# Patient Record
Sex: Female | Born: 1955
Health system: Southern US, Community
[De-identification: ages and names within clinical notes are randomized; demographics above are authoritative.]

## PROBLEM LIST (undated history)

## (undated) DIAGNOSIS — I639 Cerebral infarction, unspecified: Secondary | ICD-10-CM

## (undated) DIAGNOSIS — T7840XA Allergy, unspecified, initial encounter: Secondary | ICD-10-CM

## (undated) DIAGNOSIS — Z972 Presence of dental prosthetic device (complete) (partial): Secondary | ICD-10-CM

## (undated) DIAGNOSIS — E785 Hyperlipidemia, unspecified: Secondary | ICD-10-CM

## (undated) DIAGNOSIS — I10 Essential (primary) hypertension: Secondary | ICD-10-CM

## (undated) DIAGNOSIS — K219 Gastro-esophageal reflux disease without esophagitis: Secondary | ICD-10-CM

## (undated) HISTORY — DX: Hyperlipidemia, unspecified: E78.5

## (undated) HISTORY — DX: Essential (primary) hypertension: I10

## (undated) HISTORY — PX: BRAIN SURGERY: SHX531

## (undated) HISTORY — PX: VOCAL CORD LATERALIZATION, ENDOSCOPIC APPROACH W/ MLB: SHX2664

## (undated) HISTORY — DX: Allergy, unspecified, initial encounter: T78.40XA

---

## 2008-08-21 DIAGNOSIS — I639 Cerebral infarction, unspecified: Secondary | ICD-10-CM

## 2008-08-21 HISTORY — DX: Cerebral infarction, unspecified: I63.9

## 2014-12-10 LAB — HM PAP SMEAR

## 2018-05-09 ENCOUNTER — Encounter: Payer: Self-pay | Admitting: Nurse Practitioner

## 2018-05-09 ENCOUNTER — Ambulatory Visit: Payer: BLUE CROSS/BLUE SHIELD | Admitting: Nurse Practitioner

## 2018-05-09 ENCOUNTER — Other Ambulatory Visit: Payer: Self-pay

## 2018-05-09 VITALS — BP 128/65 | HR 74 | Temp 98.1°F | Ht 59.5 in | Wt 149.0 lb

## 2018-05-09 DIAGNOSIS — R5383 Other fatigue: Secondary | ICD-10-CM

## 2018-05-09 DIAGNOSIS — E7800 Pure hypercholesterolemia, unspecified: Secondary | ICD-10-CM

## 2018-05-09 DIAGNOSIS — I10 Essential (primary) hypertension: Secondary | ICD-10-CM

## 2018-05-09 DIAGNOSIS — K219 Gastro-esophageal reflux disease without esophagitis: Secondary | ICD-10-CM | POA: Diagnosis not present

## 2018-05-09 DIAGNOSIS — L308 Other specified dermatitis: Secondary | ICD-10-CM

## 2018-05-09 DIAGNOSIS — Z7689 Persons encountering health services in other specified circumstances: Secondary | ICD-10-CM | POA: Diagnosis not present

## 2018-05-09 MED ORDER — CLOTRIMAZOLE-BETAMETHASONE 1-0.05 % EX CREA: 1.0000 "application " | TOPICAL_CREAM | Freq: Two times a day (BID) | CUTANEOUS | 1 refills | Status: AC

## 2018-05-09 MED ORDER — OMEPRAZOLE 20 MG PO CPDR: 20.0000 mg | DELAYED_RELEASE_CAPSULE | Freq: Every day | ORAL | 3 refills | Status: DC

## 2018-05-09 MED ORDER — TRIAMCINOLONE ACETONIDE 0.1 % EX CREA: 1.0000 "application " | TOPICAL_CREAM | Freq: Two times a day (BID) | CUTANEOUS | 1 refills | Status: DC

## 2018-05-09 NOTE — Progress Notes (Signed)
Subjective:    Patient ID: Ashley Hayes, female    DOB: [birth year], 62 y.o.   MRN: [id number]  Christiona Siddique is a 62 y.o. female presenting on 05/09/2018 for Establish Care (brain surgery x August 2018. Possible eczema behind the ears, under breast )   HPI Establish Care New Provider Pt last seen by PCP many years ago.  Has had recent brain surgery, but not locally.  Request records if patient is able to supply location.    Rash Skin rash behind ears, under left breast and inter labial folds.  Scaly skin, sometimes red, raw excoriated.  ? Eczema/psoriasis.  Has had this rash even in [place]  Was worse in heat/summer.  Tries to avoid underwear at night.  Cotton underwear, depends.  Has stress incontinence.  GERD Stomach pain, burning, heartburn with water or meals.  Bowel issues - incontinence occasionally (about 1x per month).  Tums help, but uses 2-4 per day.  Past Medical History:  Diagnosis Date  . Allergy   . Hyperlipidemia   . Hypertension    Past Surgical History:  Procedure Laterality Date  . BRAIN SURGERY  2010   brsin surgery  . VOCAL CORD LATERALIZATION, ENDOSCOPIC APPROACH W/ MLB       Social History   Socioeconomic History  . Marital status: Divorced    Spouse name: Not on file  . Number of children: Not on file  . Years of education: Not on file  . Highest education level: High school graduate  Occupational History  . Not on file  Social Needs  . Financial resource strain: Not hard at all  . Food insecurity:    Worry: Never true    Inability: Never true  . Transportation needs:    Medical: No    Non-medical: No  Tobacco Use  . Smoking status: Current Every Day Smoker    Packs/day: 0.25    Years: 45.00    Pack years: 11.25  . Smokeless tobacco: Never Used  Substance and Sexual Activity  . Alcohol use: Yes    Frequency: Never    Comment: rarely  . Drug use: Never  . Sexual activity: Not on file  Lifestyle  . Physical activity:    Days per  week: 7 days    Minutes per session: 60 min  . Stress: Not on file  Relationships  . Social connections:    Talks on phone: Not on file    Gets together: Not on file    Attends religious service: Not on file    Active member of club or organization: Not on file    Attends meetings of clubs or organizations: Not on file    Relationship status: Not on file  . Intimate partner violence:    Fear of current or ex partner: Not on file    Emotionally abused: Not on file    Physically abused: Not on file    Forced sexual activity: Not on file  Other Topics Concern  . Not on file  Social History Narrative  . Not on file   Family History  Problem Relation Age of Onset  . Breast cancer Mother   . Stomach cancer Father   . Thyroid disease Sister   . Esophageal cancer Sister   . Thyroid disease Brother   . Ovarian cancer Sister   . Thyroid disease Sister   . Prostate cancer Brother   . Leukemia Brother    Current Outpatient Medications on File Prior to  Visit  Medication Sig  . lisinopril-hydrochlorothiazide (PRINZIDE,ZESTORETIC) 10-12.5 MG tablet    No current facility-administered medications on file prior to visit.     Review of Systems  Constitutional: Positive for fatigue. Negative for chills and fever.  HENT: Negative for congestion and sore throat.   Eyes: Negative for pain.  Respiratory: Negative for cough, shortness of breath and wheezing.   Cardiovascular: Negative for chest pain, palpitations and leg swelling.  Gastrointestinal: Positive for abdominal pain (heartburn). Negative for blood in stool, constipation, diarrhea, nausea and vomiting.  Endocrine: Negative for polydipsia.  Musculoskeletal: Negative for back pain, myalgias and neck pain.  Skin: Positive for rash.  Allergic/Immunologic: Negative for environmental allergies.  Neurological: Negative for dizziness, weakness and headaches.  Hematological: Does not bruise/bleed easily.  Psychiatric/Behavioral:  Negative for dysphoric mood and suicidal ideas. The patient is not nervous/anxious.    Per HPI unless specifically indicated above     Objective:    BP 128/65 (BP Location: Right Arm, Patient Position: Sitting, Cuff Size: Normal)   Pulse 74   Temp 98.1 F (36.7 C) (Oral)   Ht 4' 11.5" (1.511 m)   Wt 149 lb (67.6 kg)   BMI 29.59 kg/m   Wt Readings from Last 3 Encounters:  05/09/18 149 lb (67.6 kg)    Physical Exam  Constitutional: She is oriented to person, place, and time. She appears well-developed and well-nourished. No distress.  HENT:  Head: Normocephalic and atraumatic.  Neurological: She is alert and oriented to person, place, and time.  Skin: Skin is warm and dry. Capillary refill takes less than 2 seconds. Rash (erythema without flakiness or break in skin integrity located in folds of skin) noted.  Psychiatric: She has a normal mood and affect. Her behavior is normal. Judgment and thought content normal.  Vitals reviewed.   Results for orders placed or performed in visit on 05/09/18  CBC with Differential/Platelet  Result Value Ref Range   WBC 7.4 3.8 - 10.8 Thousand/uL   RBC 4.89 3.80 - 5.10 Million/uL   Hemoglobin 14.3 11.7 - 15.5 g/dL   HCT 43.4 35.0 - 45.0 %   MCV 88.8 80.0 - 100.0 fL   MCH 29.2 27.0 - 33.0 pg   MCHC 32.9 32.0 - 36.0 g/dL   RDW 13.0 11.0 - 15.0 %   Platelets 186 140 - 400 Thousand/uL   MPV 11.2 7.5 - 12.5 fL   Neutro Abs 4,662 1,500 - 7,800 cells/uL   Lymphs Abs 2,079 850 - 3,900 cells/uL   WBC mixed population 533 200 - 950 cells/uL   Eosinophils Absolute 89 15 - 500 cells/uL   Basophils Absolute 37 0 - 200 cells/uL   Neutrophils Relative % 63 %   Total Lymphocyte 28.1 %   Monocytes Relative 7.2 %   Eosinophils Relative 1.2 %   Basophils Relative 0.5 %  COMPLETE METABOLIC PANEL WITH GFR  Result Value Ref Range   Glucose, Bld 105 (H) 65 - 99 mg/dL   BUN 24 7 - 25 mg/dL   Creat 1.01 (H) 0.50 - 0.99 mg/dL   GFR, Est Non African  American 60 > OR = 60 mL/min/1.35m2   GFR, Est African American 69 > OR = 60 mL/min/1.28m2   BUN/Creatinine Ratio 24 (H) 6 - 22 (calc)   Sodium 138 135 - 146 mmol/L   Potassium 4.0 3.5 - 5.3 mmol/L   Chloride 102 98 - 110 mmol/L   CO2 26 20 - 32 mmol/L   Calcium 9.7  8.6 - 10.4 mg/dL   Total Protein 7.7 6.1 - 8.1 g/dL   Albumin 4.4 3.6 - 5.1 g/dL   Globulin 3.3 1.9 - 3.7 g/dL (calc)   AG Ratio 1.3 1.0 - 2.5 (calc)   Total Bilirubin 0.3 0.2 - 1.2 mg/dL   Alkaline phosphatase (APISO) 84 33 - 130 U/L   AST 24 10 - 35 U/L   ALT 24 6 - 29 U/L  Lipid panel  Result Value Ref Range   Cholesterol 223 (H) <200 mg/dL   HDL 57 >50 mg/dL   Triglycerides 80 <150 mg/dL   LDL Cholesterol (Calc) 148 (H) mg/dL (calc)   Total CHOL/HDL Ratio 3.9 <5.0 (calc)   Non-HDL Cholesterol (Calc) 166 (H) <130 mg/dL (calc)  TSH  Result Value Ref Range   TSH 2.84 0.40 - 4.50 mIU/L      Assessment & Plan:   Problem List Items Addressed This Visit    None    Visit Diagnoses    Gastroesophageal reflux disease, esophagitis presence not specified    -  Primary Status unknown.  Recheck labs.  Continue meds without changes today.  Refills provided. Followup 1 months and after labs.    Relevant Medications   omeprazole (PRILOSEC) 20 MG capsule   Other Relevant Orders   CBC with Differential/Platelet (Completed)   Encounter to establish care     Previous PCP was at an unknown location.  Records will not be requested.  Past medical, family, and surgical history reviewed w/ pt.     Dermatitis associated with moisture     Consistent with moisture associated dermatitis. Similar to previous exposures during warm months. - Afebrile, no evidence of bacterial superinfection or loss of epidermal integrity.  Plan: 1. Start triamcinolone cream bid x 14 days.  Cautioned skin atrophy with prolonged use. 2. Avoid scratching 3. Return criteria given, 2 weeks if worsening or not improving    Relevant Medications    triamcinolone cream (KENALOG) 0.1 %   Fatigue, unspecified type     Patient states she has fatigue.  No known cause.  Labs today.  Follow-up after labs and in 3 months.   Relevant Orders   CBC with Differential/Platelet (Completed)   COMPLETE METABOLIC PANEL WITH GFR (Completed)   Lipid panel (Completed)   TSH (Completed)   Pure hypercholesterolemia     Status unknown.  Recheck labs.  Continue meds without changes today.  Refills provided. Followup in 1 months and after labs.    Relevant Medications   lisinopril-hydrochlorothiazide (PRINZIDE,ZESTORETIC) 10-12.5 MG tablet   atorvastatin (LIPITOR) 20 MG tablet   Other Relevant Orders   Lipid panel (Completed)   Essential hypertension     Stable today on exam.  Medications tolerated without side effects.  Continue at current doses.  Refills provided.  Check labs today. Followup 1 months.    Relevant Medications   lisinopril-hydrochlorothiazide (PRINZIDE,ZESTORETIC) 10-12.5 MG tablet   atorvastatin (LIPITOR) 20 MG tablet   Other Relevant Orders   COMPLETE METABOLIC PANEL WITH GFR (Completed)      Meds ordered this encounter  Medications  . omeprazole (PRILOSEC) 20 MG capsule    Sig: Take 1 capsule (20 mg total) by mouth daily.    Dispense:  30 capsule    Refill:  3    Order Specific Question:   Supervising Provider    Answer:   Olin Hauser [[id number]]  . triamcinolone cream (KENALOG) 0.1 %    Sig: Apply 1 application topically 2 (two)  times daily. Apply to bilateral ears    Dispense:  30 g    Refill:  1    Order Specific Question:   Supervising Provider    Answer:   Olin Hauser [[id number]]  . clotrimazole-betamethasone (LOTRISONE) cream    Sig: Apply 1 application topically 2 (two) times daily for 14 days. Apply to breasts and groin    Dispense:  90 g    Refill:  1    Order Specific Question:   Supervising Provider    Answer:   Olin Hauser [[id number]]  . atorvastatin (LIPITOR) 20 MG tablet    Sig: Take  1 tablet (20 mg total) by mouth daily at 6 PM.    Dispense:  30 tablet    Refill:  6    Order Specific Question:   Supervising Provider    Answer:   Olin Hauser [[id number]]   Follow up plan: Return in about 4 weeks (around 06/06/2018) for hypertension, cholesterol, fatigue.  Cassell Smiles, DNP, AGPCNP-BC Adult Gerontology Primary Care Nurse Practitioner [facility] 05/09/2018, 2:48 PM

## 2018-05-09 NOTE — Patient Instructions (Addendum)
Darel Hong,   Thank you for coming in to clinic today.  1. Moisture dermatitis under breast and groin.  Keep genital region clean and dry.   2.  Ears have what is most likely psoriasis.  3. Reflux: - Avoid trigger foods - START omeprazole 20 mg once daily 30 minutes before meals.  4. Fatigue: labs  You will be due for FASTING BLOOD WORK.  This means you should eat no food or drink after midnight.  Drink only water or coffee without cream/sugar on the morning of your lab visit. - Please go ahead and schedule a "Lab Only" visit in the morning at the clinic for lab draw in the next 7 days. - Your results will be available about 2-3 days after blood draw.  If you have set up a MyChart account, you can can log in to MyChart online to view your results and a brief explanation. Also, we can discuss your results together at your next office visit if you would like.  Please schedule a follow-up appointment with Cassell Smiles, AGNP. Return in about 4 weeks (around 06/06/2018) for hypertension, cholesterol, fatigue.  If you have any other questions or concerns, please feel free to call the clinic or send a message through [place]. You may also schedule an earlier appointment if necessary.  You will receive a survey after today's visit either digitally by e-mail or paper by [employer]. Your experiences and feedback matter to [place].  Please respond so we know how we are doing as we provide care for you.   Cassell Smiles, DNP, AGNP-BC Adult Gerontology Nurse Practitioner [place]

## 2018-05-10 ENCOUNTER — Other Ambulatory Visit: Payer: BLUE CROSS/BLUE SHIELD

## 2018-05-10 LAB — COMPLETE METABOLIC PANEL WITH GFR
AG Ratio: 1.3 (calc) (ref 1.0–2.5)
ALT: 24 U/L (ref 6–29)
AST: 24 U/L (ref 10–35)
Albumin: 4.4 g/dL (ref 3.6–5.1)
Alkaline phosphatase (APISO): 84 U/L (ref 33–130)
BUN/Creatinine Ratio: 24 (calc) — ABNORMAL HIGH (ref 6–22)
BUN: 24 mg/dL (ref 7–25)
CO2: 26 mmol/L (ref 20–32)
Calcium: 9.7 mg/dL (ref 8.6–10.4)
Chloride: 102 mmol/L (ref 98–110)
Creat: 1.01 mg/dL — ABNORMAL HIGH (ref 0.50–0.99)
GFR, Est African American: 69 mL/min/{1.73_m2} (ref >=60)
GFR, Est Non African American: 60 mL/min/{1.73_m2} (ref >=60)
Globulin: 3.3 g/dL (calc) (ref 1.9–3.7)
Glucose, Bld: 105 mg/dL — ABNORMAL HIGH (ref 65–99)
Potassium: 4 mmol/L (ref 3.5–5.3)
Sodium: 138 mmol/L (ref 135–146)
Total Bilirubin: 0.3 mg/dL (ref 0.2–1.2)
Total Protein: 7.7 g/dL (ref 6.1–8.1)

## 2018-05-10 LAB — CBC WITH DIFFERENTIAL/PLATELET
Basophils Absolute: 37 cells/uL (ref 0–200)
Basophils Relative: 0.5 %
Eosinophils Absolute: 89 cells/uL (ref 15–500)
Eosinophils Relative: 1.2 %
HCT: 43.4 % (ref 35.0–45.0)
Hemoglobin: 14.3 g/dL (ref 11.7–15.5)
Lymphs Abs: 2079 cells/uL (ref 850–3900)
MCH: 29.2 pg (ref 27.0–33.0)
MCHC: 32.9 g/dL (ref 32.0–36.0)
MCV: 88.8 fL (ref 80.0–100.0)
MPV: 11.2 fL (ref 7.5–12.5)
Monocytes Relative: 7.2 %
Neutro Abs: 4662 cells/uL (ref 1500–7800)
Neutrophils Relative %: 63 %
Platelets: 186 10*3/uL (ref 140–400)
RBC: 4.89 10*6/uL (ref 3.80–5.10)
RDW: 13 % (ref 11.0–15.0)
Total Lymphocyte: 28.1 %
WBC mixed population: 533 cells/uL (ref 200–950)
WBC: 7.4 10*3/uL (ref 3.8–10.8)

## 2018-05-10 LAB — LIPID PANEL
Cholesterol: 223 mg/dL — ABNORMAL HIGH (ref <200)
HDL: 57 mg/dL (ref >50)
LDL Cholesterol (Calc): 148 mg/dL (calc) — ABNORMAL HIGH
Non-HDL Cholesterol (Calc): 166 mg/dL (calc) — ABNORMAL HIGH (ref <130)
Total CHOL/HDL Ratio: 3.9 (calc) (ref <5.0)
Triglycerides: 80 mg/dL (ref <150)

## 2018-05-10 LAB — TSH: TSH: 2.84 mIU/L (ref 0.40–4.50)

## 2018-05-13 MED ORDER — ATORVASTATIN CALCIUM 20 MG PO TABS: 20.0000 mg | ORAL_TABLET | Freq: Every day | ORAL | 6 refills | Status: DC

## 2018-07-04 ENCOUNTER — Encounter: Payer: BLUE CROSS/BLUE SHIELD | Admitting: Nurse Practitioner

## 2018-07-09 ENCOUNTER — Encounter

## 2018-07-09 ENCOUNTER — Ambulatory Visit: Payer: Self-pay | Admitting: Family Medicine

## 2018-07-11 ENCOUNTER — Encounter: Payer: Self-pay | Admitting: Nurse Practitioner

## 2018-08-15 ENCOUNTER — Ambulatory Visit
Admission: RE | Admit: 2018-08-15 | Discharge: 2018-08-15 | Disposition: A | Discharge status: Home / Self Care | Payer: BLUE CROSS/BLUE SHIELD | Source: Ambulatory Visit | Attending: Nurse Practitioner | Admitting: Nurse Practitioner

## 2018-08-15 ENCOUNTER — Ambulatory Visit: Payer: BLUE CROSS/BLUE SHIELD | Admitting: Nurse Practitioner

## 2018-08-15 ENCOUNTER — Encounter: Payer: Self-pay | Admitting: Nurse Practitioner

## 2018-08-15 VITALS — BP 159/75 | HR 72 | Temp 98.5°F | Resp 16 | Ht 59.0 in | Wt 161.0 lb

## 2018-08-15 DIAGNOSIS — M79671 Pain in right foot: Secondary | ICD-10-CM

## 2018-08-15 DIAGNOSIS — Z8669 Personal history of other diseases of the nervous system and sense organs: Secondary | ICD-10-CM

## 2018-08-15 DIAGNOSIS — Z716 Tobacco abuse counseling: Secondary | ICD-10-CM

## 2018-08-15 MED ORDER — BUPROPION HCL ER (SR) 150 MG PO TB12: 150.0000 mg | ORAL_TABLET | Freq: Two times a day (BID) | ORAL | 2 refills | Status: DC

## 2018-08-15 NOTE — Patient Instructions (Addendum)
Darel Hong,   Thank you for coming in to clinic today.  1. For your smoking cessation, we are going to start Wellbutrin: Start bupropion SR (Wellbutrin SR) 150mg  tablet 7 days BEFORE your quit date.    Your quit date: 08/21/2018      START Wellbutrin on: 08/15/18  - Take Wellbutrin 150mg  1 tablet once daily for 3 days - Then, take 1 tablet TWICE daily (about every 12 hours) and continue at this dose for 7 weeks.  To quit smoking:  - Only start this treatment if you are mentally ready to quit. - Choose a quit date at least 7 days in the future.  - Start taking the medicine about 1-2 weeks before your quit date.  - Reduce the number of cigarettes you smoke each day you are on this medication until you eventually QUIT COMPLETELY on your quit date.  - Continue taking the Wellbutrin twice daily for total 7 weeks, we can continue this medication for up to 12 weeks.  2. Start taking Tylenol extra strength 1 to 2 tablets every 6-8 hours for aches or fever/chills for next few days as needed.  Do not take more than 3,000 mg in 24 hours from all medicines.  May take Ibuprofen as well if tolerated 200-400mg  every 8 hours as needed. May alternate tylenol and ibuprofen in same day. - Use heat and ice.  Apply this for 15 minutes at a time 6-8 times per day.   - Muscle rub with lidocaine, lidocaine patch, Biofreeze, or tiger balm for topical pain relief.  Avoid using this with heat and ice to avoid burns.  Please schedule a follow-up appointment with Cassell Smiles, AGNP. Return in about 6 weeks (around 09/26/2018) for smoking cessation.  If you have any other questions or concerns, please feel free to call the clinic or send a message through [place]. You may also schedule an earlier appointment if necessary.  You will receive a survey after today's visit either digitally by e-mail or paper by [employer]. Your experiences and feedback matter to [place].  Please respond so we know how we are doing as we provide  care for you.   Cassell Smiles, DNP, AGNP-BC Adult Gerontology Nurse Practitioner [place]

## 2018-08-15 NOTE — Progress Notes (Signed)
Subjective:    Patient ID: Ashley Hayes, female    DOB: 1956/07/20, 62 y.o.   MRN: [id number]  Ashley Hayes is a 62 y.o. female presenting on 08/15/2018 for Foot Swelling (Right side onset 3 weeks)   HPI Foot swelling Right sided foot swelling for 3 weeks.   Has had foot elevated and used ice for swelling without improvement. - Patient recalls no injury - denies leg pain - prior redness, but has improved over last couple of days.   - Pain and tenderness worse with walking  Cigarette smoking Patient has new year's resolution for quitting smoking Patient has had success with patch in past, but had environmental stressor from other smokers and restarted. - Chantix was great for cessation in past, but second time had nightmares and HI and does not wish  - Has not used wellbutrin in past.  History of cyst in brain Patient has not had recent followup after brain cyst removal 2018. Prior cyst s/p removal also in 2010.  Patient needs neurology follow-up for surveillance.  Past Medical History:  Diagnosis Date  . Allergy   . Hyperlipidemia   . Hypertension     Past Surgical History:  Procedure Laterality Date  . BRAIN SURGERY  2010   brsin surgery  . VOCAL CORD LATERALIZATION, ENDOSCOPIC APPROACH W/ MLB       Social History   Tobacco Use  . Smoking status: Current Every Day Smoker    Packs/day: 0.25    Years: 45.00    Pack years: 11.25  . Smokeless tobacco: Current User  Substance Use Topics  . Alcohol use: Yes    Frequency: Never    Comment: rarely  . Drug use: Never    Review of Systems Per HPI unless specifically indicated above     Objective:    BP (!) 159/75   Pulse 72   Temp 98.5 F (36.9 C) (Oral)   Resp 16   Ht 4\' 11"  (1.499 m)   Wt 161 lb (73 kg)   BMI 32.52 kg/m   Wt Readings from Last 3 Encounters:  08/15/18 161 lb (73 kg)  05/09/18 149 lb (67.6 kg)    Physical Exam Vitals signs reviewed.  Constitutional:      General: She is not in  acute distress.    Appearance: She is well-developed.  HENT:     Head: Normocephalic and atraumatic.  Cardiovascular:     Rate and Rhythm: Normal rate and regular rhythm.     Pulses:          Radial pulses are 2+ on the right side and 2+ on the left side.       Posterior tibial pulses are 1+ on the right side and 1+ on the left side.     Heart sounds: Normal heart sounds, S1 normal and S2 normal.  Pulmonary:     Effort: Pulmonary effort is normal. No respiratory distress.     Breath sounds: Normal breath sounds and air entry.  Musculoskeletal:     Right lower leg: No edema.     Left lower leg: No edema.     Comments: No bony tenderness.  Inspection reveals Mild mid-foot swelling.  Tenderness/weakness with plantar flexion due to mid-foot pain. Normal ankle ROM and is without swelling.    Skin:    General: Skin is warm and dry.     Capillary Refill: Capillary refill takes less than 2 seconds.  Neurological:     General: No focal  deficit present.     Mental Status: She is alert and oriented to person, place, and time. Mental status is at baseline.     Cranial Nerves: No cranial nerve deficit.     Sensory: No sensory deficit.     Gait: Gait normal.     Deep Tendon Reflexes: Reflexes are normal and symmetric.  Psychiatric:        Attention and Perception: Attention normal.        Mood and Affect: Mood and affect normal.        Behavior: Behavior normal. Behavior is cooperative.        Thought Content: Thought content normal.        Judgment: Judgment normal.     Results for orders placed or performed in visit on 05/09/18  CBC with Differential/Platelet  Result Value Ref Range   WBC 7.4 3.8 - 10.8 Thousand/uL   RBC 4.89 3.80 - 5.10 Million/uL   Hemoglobin 14.3 11.7 - 15.5 g/dL   HCT 43.4 35.0 - 45.0 %   MCV 88.8 80.0 - 100.0 fL   MCH 29.2 27.0 - 33.0 pg   MCHC 32.9 32.0 - 36.0 g/dL   RDW 13.0 11.0 - 15.0 %   Platelets 186 140 - 400 Thousand/uL   MPV 11.2 7.5 - 12.5 fL    Neutro Abs 4,662 1,500 - 7,800 cells/uL   Lymphs Abs 2,079 850 - 3,900 cells/uL   WBC mixed population 533 200 - 950 cells/uL   Eosinophils Absolute 89 15 - 500 cells/uL   Basophils Absolute 37 0 - 200 cells/uL   Neutrophils Relative % 63 %   Total Lymphocyte 28.1 %   Monocytes Relative 7.2 %   Eosinophils Relative 1.2 %   Basophils Relative 0.5 %  COMPLETE METABOLIC PANEL WITH GFR  Result Value Ref Range   Glucose, Bld 105 (H) 65 - 99 mg/dL   BUN 24 7 - 25 mg/dL   Creat 1.01 (H) 0.50 - 0.99 mg/dL   GFR, Est Non African American 60 > OR = 60 mL/min/1.66m2   GFR, Est African American 69 > OR = 60 mL/min/1.93m2   BUN/Creatinine Ratio 24 (H) 6 - 22 (calc)   Sodium 138 135 - 146 mmol/L   Potassium 4.0 3.5 - 5.3 mmol/L   Chloride 102 98 - 110 mmol/L   CO2 26 20 - 32 mmol/L   Calcium 9.7 8.6 - 10.4 mg/dL   Total Protein 7.7 6.1 - 8.1 g/dL   Albumin 4.4 3.6 - 5.1 g/dL   Globulin 3.3 1.9 - 3.7 g/dL (calc)   AG Ratio 1.3 1.0 - 2.5 (calc)   Total Bilirubin 0.3 0.2 - 1.2 mg/dL   Alkaline phosphatase (APISO) 84 33 - 130 U/L   AST 24 10 - 35 U/L   ALT 24 6 - 29 U/L  Lipid panel  Result Value Ref Range   Cholesterol 223 (H) <200 mg/dL   HDL 57 >50 mg/dL   Triglycerides 80 <150 mg/dL   LDL Cholesterol (Calc) 148 (H) mg/dL (calc)   Total CHOL/HDL Ratio 3.9 <5.0 (calc)   Non-HDL Cholesterol (Calc) 166 (H) <130 mg/dL (calc)  TSH  Result Value Ref Range   TSH 2.84 0.40 - 4.50 mIU/L      Assessment & Plan:   Problem List Items Addressed This Visit    None    Visit Diagnoses    Right foot pain    -  Primary Unknown origin of RIGHT foot pain. Cannot  fully exclude sprain, contusion, stress fracture.  Plan: 1. Xray Right foot 2. Treat with OTC pain meds (acetaminophen and ibuprofen).  Discussed alternate dosing and max dosing. 3. Apply heat and/or ice to affected area. 4. May also apply a muscle rub with lidocaine or lidocaine patch after heat or ice. 5. Wear a supportive, hard  bottomed shoe 6. Follow up prn no improvement in 4-6 weeks.    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  Needs routine neurology referral for follow-up.  Patient had been having regular neurology surveillance in [place] and wants to re-establish care. Referral placed.  Follow-up prn.   Relevant Orders   Ambulatory referral to Neurology   Encounter for smoking cessation counseling     Patient ready to quit smoking and wants to make this a new-year's resolution.   Discussion today >5 minutes (<10 minutes) specifically on counseling on risks of tobacco use, complications, treatment, smoking cessation.  Plan:  7 days prior to quit date, START Wellbutrin.  Take 150 mg tablet once daily 3 days.  Then, take 150 mg tablet twice daily and continue for total treatment w/ Wellbutrin of 8-12 weeks.  Stop smoking or reduce by half on quit date. - Mutually set quit date: 08/21/2018 - START date of Chantix: 08/14/2018 - Reviewed side effects of GI upset, insomnia, depression, possible SI.   Relevant Medications   buPROPion (WELLBUTRIN SR) 150 MG 12 hr tablet      Meds ordered this encounter  Medications  . buPROPion (WELLBUTRIN SR) 150 MG 12 hr tablet    Sig: Take 1 tablet (150 mg total) by mouth 2 (two) times daily.    Dispense:  60 tablet    Refill:  2    Order Specific Question:   Supervising Provider    Answer:   Olin Hauser [[id number]]    Follow up plan: Return in about 6 weeks (around 09/26/2018) for smoking cessation.  Cassell Smiles, DNP, AGPCNP-BC Adult Gerontology Primary Care Nurse Practitioner [facility] 08/15/2018, 1:47 PM

## 2018-08-19 ENCOUNTER — Other Ambulatory Visit: Payer: Self-pay

## 2018-08-19 ENCOUNTER — Other Ambulatory Visit (HOSPITAL_COMMUNITY)
Admission: RE | Admit: 2018-08-19 | Discharge: 2018-08-19 | Disposition: A | Discharge status: Home / Self Care | Payer: BLUE CROSS/BLUE SHIELD | Source: Ambulatory Visit | Attending: Nurse Practitioner | Admitting: Nurse Practitioner

## 2018-08-19 ENCOUNTER — Encounter: Payer: Self-pay | Admitting: Nurse Practitioner

## 2018-08-19 ENCOUNTER — Ambulatory Visit (INDEPENDENT_AMBULATORY_CARE_PROVIDER_SITE_OTHER): Payer: BLUE CROSS/BLUE SHIELD | Admitting: Nurse Practitioner

## 2018-08-19 VITALS — BP 145/64 | HR 79 | Temp 97.4°F | Ht 59.0 in | Wt 161.4 lb

## 2018-08-19 DIAGNOSIS — Z124 Encounter for screening for malignant neoplasm of cervix: Secondary | ICD-10-CM | POA: Insufficient documentation

## 2018-08-19 DIAGNOSIS — Z23 Encounter for immunization: Secondary | ICD-10-CM

## 2018-08-19 DIAGNOSIS — Z13820 Encounter for screening for osteoporosis: Secondary | ICD-10-CM

## 2018-08-19 DIAGNOSIS — Z1211 Encounter for screening for malignant neoplasm of colon: Secondary | ICD-10-CM | POA: Diagnosis not present

## 2018-08-19 DIAGNOSIS — Z1239 Encounter for other screening for malignant neoplasm of breast: Secondary | ICD-10-CM

## 2018-08-19 DIAGNOSIS — Z0000 Encounter for general adult medical examination without abnormal findings: Secondary | ICD-10-CM
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
## 2018-08-19 NOTE — Patient Instructions (Addendum)
Ashley Hayes,   Thank you for coming in to clinic today.  1. You will be due for FASTING BLOOD WORK.  This means you should eat no food or drink after midnight.  Drink only water or coffee without cream/sugar on the morning of your lab visit. - Please go ahead and schedule a "Lab Only" visit in the morning at the clinic for lab draw in the next 7 days. - Your results will be available about 2-3 days after blood draw.  If you have set up a MyChart account, you can can log in to MyChart online to view your results and a brief explanation. Also, we can discuss your results together at your next office visit if you would like.  2. You will get a call to schedule your colonoscopy for colon cancer screening. [facility] ([facility] [address] [place], [place] [address] Phone: [phone]  3. Your mammogram and bone density scan orders have been placed.  Call the Scheduling phone number at [phone] to schedule your mammogram and bone density at your convenience.  You can choose to go to either location listed below.  Let the scheduler know which location you prefer.  [place]  [place], [place] [address]   [facility] [address] [place], [place] [address]  Please schedule a follow-up appointment with Cassell Smiles, AGNP. Return in about 1 year (around 08/20/2019) for annual physical.  If you have any other questions or concerns, please feel free to call the clinic or send a message through [place]. You may also schedule an earlier appointment if necessary.  You will receive a survey after today's visit either digitally by e-mail or paper by [employer]. Your experiences and feedback matter to [place].  Please respond so we know how we are doing as we provide care for you.   Cassell Smiles, DNP, AGNP-BC Adult Gerontology Nurse Practitioner [place]

## 2018-08-20 ENCOUNTER — Encounter: Payer: Self-pay | Admitting: Nurse Practitioner

## 2018-08-22 ENCOUNTER — Telehealth: Payer: Self-pay

## 2018-08-22 NOTE — Telephone Encounter (Signed)
Pt is returning a call for Ashley Hayes to schedule colonoscopy

## 2018-08-23 ENCOUNTER — Other Ambulatory Visit: Payer: Self-pay

## 2018-08-23 DIAGNOSIS — Z1211 Encounter for screening for malignant neoplasm of colon: Secondary | ICD-10-CM

## 2018-08-23 LAB — TSH: TSH: 3.05 mIU/L (ref 0.40–4.50)

## 2018-08-23 LAB — CYTOLOGY - PAP
Diagnosis: NEGATIVE
HPV: NOT DETECTED

## 2018-08-23 LAB — CBC WITH DIFFERENTIAL/PLATELET
Absolute Monocytes: 579 cells/uL (ref 200–950)
Basophils Absolute: 39 cells/uL (ref 0–200)
Basophils Relative: 0.6 %
Eosinophils Absolute: 117 cells/uL (ref 15–500)
Eosinophils Relative: 1.8 %
HCT: 41.1 % (ref 35.0–45.0)
Hemoglobin: 13.9 g/dL (ref 11.7–15.5)
Lymphs Abs: 1651 cells/uL (ref 850–3900)
MCH: 29.4 pg (ref 27.0–33.0)
MCHC: 33.8 g/dL (ref 32.0–36.0)
MCV: 87.1 fL (ref 80.0–100.0)
MPV: 11.5 fL (ref 7.5–12.5)
Monocytes Relative: 8.9 %
Neutro Abs: 4115 cells/uL (ref 1500–7800)
Neutrophils Relative %: 63.3 %
Platelets: 160 10*3/uL (ref 140–400)
RBC: 4.72 10*6/uL (ref 3.80–5.10)
RDW: 13 % (ref 11.0–15.0)
Total Lymphocyte: 25.4 %
WBC: 6.5 10*3/uL (ref 3.8–10.8)

## 2018-08-23 LAB — LIPID PANEL
Cholesterol: 173 mg/dL (ref <200)
HDL: 59 mg/dL (ref >50)
LDL Cholesterol (Calc): 93 mg/dL (calc)
Non-HDL Cholesterol (Calc): 114 mg/dL (calc) (ref <130)
Total CHOL/HDL Ratio: 2.9 (calc) (ref <5.0)
Triglycerides: 112 mg/dL (ref <150)

## 2018-08-23 LAB — HEMOGLOBIN A1C
Hgb A1c MFr Bld: 6.3 % of total Hgb — ABNORMAL HIGH (ref <5.7)
Mean Plasma Glucose: 134 (calc)
eAG (mmol/L): 7.4 (calc)

## 2018-08-23 LAB — COMPLETE METABOLIC PANEL WITH GFR
AG Ratio: 1.2 (calc) (ref 1.0–2.5)
ALT: 23 U/L (ref 6–29)
AST: 23 U/L (ref 10–35)
Albumin: 3.8 g/dL (ref 3.6–5.1)
Alkaline phosphatase (APISO): 98 U/L (ref 33–130)
BUN: 14 mg/dL (ref 7–25)
CO2: 28 mmol/L (ref 20–32)
Calcium: 9.2 mg/dL (ref 8.6–10.4)
Chloride: 104 mmol/L (ref 98–110)
Creat: 0.79 mg/dL (ref 0.50–0.99)
GFR, Est African American: 92 mL/min/{1.73_m2} (ref >=60)
GFR, Est Non African American: 80 mL/min/{1.73_m2} (ref >=60)
Globulin: 3.1 g/dL (calc) (ref 1.9–3.7)
Glucose, Bld: 105 mg/dL — ABNORMAL HIGH (ref 65–99)
Potassium: 3.9 mmol/L (ref 3.5–5.3)
Sodium: 140 mmol/L (ref 135–146)
Total Bilirubin: 0.4 mg/dL (ref 0.2–1.2)
Total Protein: 6.9 g/dL (ref 6.1–8.1)

## 2018-08-23 LAB — HEPATITIS C ANTIBODY
Hepatitis C Ab: NONREACTIVE
SIGNAL TO CUT-OFF: 0.04 (ref <1.00)

## 2018-08-23 LAB — HIV ANTIBODY (ROUTINE TESTING W REFLEX): HIV 1&2 Ab, 4th Generation: NONREACTIVE

## 2018-08-23 NOTE — Telephone Encounter (Signed)
Patients call has been returned.  She has been scheduled for her colonoscopy with Dr. Allen Norris 09/06/18 at [facility].  Thanks [employer]

## 2018-09-02 ENCOUNTER — Other Ambulatory Visit: Payer: Self-pay

## 2018-09-02 ENCOUNTER — Encounter: Payer: Self-pay | Admitting: *Deleted

## 2018-09-05 ENCOUNTER — Telehealth: Payer: Self-pay | Admitting: Nurse Practitioner

## 2018-09-05 NOTE — Telephone Encounter (Signed)
Pt wants to make sure her colonoscopy is coded as preventative so she doesn't have to pay a large copay tomorrow [phone]

## 2018-09-05 NOTE — Discharge Instructions (Signed)
General Anesthesia, Adult, Care After  This sheet gives you information about how to care for yourself after your procedure. Your health care provider may also give you more specific instructions. If you have problems or questions, contact your health care provider.  What can I expect after the procedure?  After the procedure, the following side effects are common:  Pain or discomfort at the IV site.  Nausea.  Vomiting.  Sore throat.  Trouble concentrating.  Feeling cold or chills.  Weak or tired.  Sleepiness and fatigue.  Soreness and body aches. These side effects can affect parts of the body that were not involved in surgery.  Follow these instructions at home:    For at least 24 hours after the procedure:  Have a responsible adult stay with you. It is important to have someone help care for you until you are awake and alert.  Rest as needed.  Do not:  Participate in activities in which you could fall or become injured.  Drive.  Use heavy machinery.  Drink alcohol.  Take sleeping pills or medicines that cause drowsiness.  Make important decisions or sign legal documents.  Take care of children on your own.  Eating and drinking  Follow any instructions from your health care provider about eating or drinking restrictions.  When you feel hungry, start by eating small amounts of foods that are soft and easy to digest (bland), such as toast. Gradually return to your regular diet.  Drink enough fluid to keep your urine pale yellow.  If you vomit, rehydrate by drinking water, juice, or clear broth.  General instructions  If you have sleep apnea, surgery and certain medicines can increase your risk for breathing problems. Follow instructions from your health care provider about wearing your sleep device:  Anytime you are sleeping, including during daytime naps.  While taking prescription pain medicines, sleeping medicines, or medicines that make you drowsy.  Return to your normal activities as told by your health care  provider. Ask your health care provider what activities are safe for you.  Take over-the-counter and prescription medicines only as told by your health care provider.  If you smoke, do not smoke without supervision.  Keep all follow-up visits as told by your health care provider. This is important.  Contact a health care provider if:  You have nausea or vomiting that does not get better with medicine.  You cannot eat or drink without vomiting.  You have pain that does not get better with medicine.  You are unable to pass urine.  You develop a skin rash.  You have a fever.  You have redness around your IV site that gets worse.  Get help right away if:  You have difficulty breathing.  You have chest pain.  You have blood in your urine or stool, or you vomit blood.  Summary  After the procedure, it is common to have a sore throat or nausea. It is also common to feel tired.  Have a responsible adult stay with you for the first 24 hours after general anesthesia. It is important to have someone help care for you until you are awake and alert.  When you feel hungry, start by eating small amounts of foods that are soft and easy to digest (bland), such as toast. Gradually return to your regular diet.  Drink enough fluid to keep your urine pale yellow.  Return to your normal activities as told by your health care provider. Ask your health care   provider what activities are safe for you.  This information is not intended to replace advice given to you by your health care provider. Make sure you discuss any questions you have with your health care provider.  Document Released: 11/13/2000 Document Revised: 03/23/2017 Document Reviewed: 03/23/2017  Elsevier Interactive Patient Education  2019 [employer]vier Inc[employer]

## 2018-09-05 NOTE — Telephone Encounter (Signed)
This will need to be addressed with her GI specialist who performs the service.  We do not bill or code anything in relationship to her colonoscopy.

## 2018-09-05 NOTE — Telephone Encounter (Signed)
Please advise 

## 2018-09-06 ENCOUNTER — Ambulatory Visit: Payer: BLUE CROSS/BLUE SHIELD | Admitting: Anesthesiology

## 2018-09-06 ENCOUNTER — Encounter
Admission: RE | Disposition: A | Discharge status: Home / Self Care | Payer: Self-pay | Source: Home / Self Care | Attending: Gastroenterology

## 2018-09-06 ENCOUNTER — Ambulatory Visit
Admission: RE | Admit: 2018-09-06 | Discharge: 2018-09-06 | Disposition: A | Discharge status: Home / Self Care | Payer: BLUE CROSS/BLUE SHIELD | Attending: Gastroenterology | Admitting: Gastroenterology

## 2018-09-06 DIAGNOSIS — K641 Second degree hemorrhoids: Secondary | ICD-10-CM | POA: Insufficient documentation

## 2018-09-06 DIAGNOSIS — Z6831 Body mass index (BMI) 31.0-31.9, adult: Secondary | ICD-10-CM | POA: Diagnosis not present

## 2018-09-06 DIAGNOSIS — D124 Benign neoplasm of descending colon: Secondary | ICD-10-CM

## 2018-09-06 DIAGNOSIS — D125 Benign neoplasm of sigmoid colon: Secondary | ICD-10-CM | POA: Diagnosis not present

## 2018-09-06 DIAGNOSIS — K5730 Diverticulosis of large intestine without perforation or abscess without bleeding: Secondary | ICD-10-CM | POA: Diagnosis not present

## 2018-09-06 DIAGNOSIS — I10 Essential (primary) hypertension: Secondary | ICD-10-CM | POA: Insufficient documentation

## 2018-09-06 DIAGNOSIS — Z79899 Other long term (current) drug therapy: Secondary | ICD-10-CM | POA: Diagnosis not present

## 2018-09-06 DIAGNOSIS — Z800 Family history of malignant neoplasm of digestive organs: Secondary | ICD-10-CM | POA: Diagnosis not present

## 2018-09-06 DIAGNOSIS — E785 Hyperlipidemia, unspecified: Secondary | ICD-10-CM | POA: Diagnosis not present

## 2018-09-06 DIAGNOSIS — Z8673 Personal history of transient ischemic attack (TIA), and cerebral infarction without residual deficits: Secondary | ICD-10-CM | POA: Diagnosis not present

## 2018-09-06 DIAGNOSIS — Z1211 Encounter for screening for malignant neoplasm of colon: Secondary | ICD-10-CM

## 2018-09-06 DIAGNOSIS — F17210 Nicotine dependence, cigarettes, uncomplicated: Secondary | ICD-10-CM | POA: Diagnosis not present

## 2018-09-06 DIAGNOSIS — K621 Rectal polyp: Secondary | ICD-10-CM | POA: Diagnosis not present

## 2018-09-06 DIAGNOSIS — K635 Polyp of colon: Secondary | ICD-10-CM | POA: Diagnosis not present

## 2018-09-06 DIAGNOSIS — K219 Gastro-esophageal reflux disease without esophagitis: Secondary | ICD-10-CM | POA: Insufficient documentation

## 2018-09-06 DIAGNOSIS — E669 Obesity, unspecified: Secondary | ICD-10-CM | POA: Diagnosis not present

## 2018-09-06 HISTORY — DX: Gastro-esophageal reflux disease without esophagitis: K21.9

## 2018-09-06 HISTORY — PX: POLYPECTOMY: SHX5525

## 2018-09-06 HISTORY — DX: Presence of dental prosthetic device (complete) (partial): Z97.2

## 2018-09-06 HISTORY — DX: Cerebral infarction, unspecified: I63.9

## 2018-09-06 HISTORY — PX: COLONOSCOPY WITH PROPOFOL: SHX5780

## 2018-09-06 SURGERY — COLONOSCOPY WITH PROPOFOL
Anesthesia: General | Site: Rectum

## 2018-09-06 MED ORDER — ONDANSETRON HCL 4 MG/2ML IJ SOLN: 4.0000 mg | Freq: Once | INTRAMUSCULAR | Status: DC | PRN

## 2018-09-06 MED ORDER — PROPOFOL 10 MG/ML IV BOLUS
INTRAVENOUS | Status: DC | PRN
  Administered 2018-09-06: 100 mg via INTRAVENOUS
  Administered 2018-09-06: 50 mg via INTRAVENOUS
  Administered 2018-09-06: 20 mg via INTRAVENOUS

## 2018-09-06 MED ORDER — SODIUM CHLORIDE 0.9 % IV SOLN: INTRAVENOUS | Status: DC

## 2018-09-06 MED ORDER — LIDOCAINE HCL (CARDIAC) PF 100 MG/5ML IV SOSY
PREFILLED_SYRINGE | INTRAVENOUS | Status: DC | PRN
  Administered 2018-09-06: 30 mg via INTRAVENOUS

## 2018-09-06 MED ORDER — LACTATED RINGERS IV SOLN
10.0000 mL/h | INTRAVENOUS | Status: DC
  Administered 2018-09-06: 10 mL/h via INTRAVENOUS

## 2018-09-06 MED ORDER — STERILE WATER FOR IRRIGATION IR SOLN
Status: DC | PRN
  Administered 2018-09-06: 10:00:00

## 2018-09-06 SURGICAL SUPPLY — 7 items
CANISTER SUCT 1200ML W/VALVE (MISCELLANEOUS) ×4 IMPLANT
FORCEPS BIOP RAD 4 LRG CAP 4 (CUTTING FORCEPS) ×2 IMPLANT
GOWN CVR UNV OPN BCK APRN NK (MISCELLANEOUS) ×4 IMPLANT
GOWN ISOL THUMB LOOP REG UNIV (MISCELLANEOUS) ×4
SNARE SHORT THROW 13M SML OVAL (MISCELLANEOUS) ×2 IMPLANT
TRAP ETRAP POLY (MISCELLANEOUS) ×2 IMPLANT
WATER STERILE IRR 250ML POUR (IV SOLUTION) ×4 IMPLANT

## 2018-09-06 NOTE — Transfer of Care (Signed)
Immediate Anesthesia Transfer of Care Note  Patient: Ashley Hayes  Procedure(s) Performed: COLONOSCOPY WITH PROPOFOL (N/A Rectum) POLYPECTOMY (Rectum)  Patient Location: PACU  Anesthesia Type: General  Level of Consciousness: awake, alert  and patient cooperative  Airway and Oxygen Therapy: Patient Spontanous Breathing and Patient connected to supplemental oxygen  Post-op Assessment: Post-op Vital signs reviewed, Patient's Cardiovascular Status Stable, Respiratory Function Stable, Patent Airway and No signs of Nausea or vomiting  Post-op Vital Signs: Reviewed and stable  Complications: No apparent anesthesia complications

## 2018-09-06 NOTE — Anesthesia Postprocedure Evaluation (Signed)
Anesthesia Post Note  Patient: Ashley Hayes  Procedure(s) Performed: COLONOSCOPY WITH PROPOFOL (N/A Rectum) POLYPECTOMY (Rectum)  Patient location during evaluation: PACU Anesthesia Type: General Level of consciousness: awake Pain management: pain level controlled Vital Signs Assessment: post-procedure vital signs reviewed and stable Respiratory status: respiratory function stable Cardiovascular status: stable Postop Assessment: no signs of nausea or vomiting Anesthetic complications: no    Veda Canning

## 2018-09-06 NOTE — H&P (Signed)
Ashley Lame, MD [facility] [address], [place], [place] [address] Phone: [phone] Fax : [phone]  Primary Care Physician:  Mikey College, NP Primary Gastroenterologist:  Dr. Allen Norris  Pre-Procedure History & Physical: HPI:  Ashley Hayes is a 63 y.o. female is here for a screening colonoscopy.   Past Medical History:  Diagnosis Date  . Allergy   . GERD (gastroesophageal reflux disease)   . Hyperlipidemia   . Hypertension   . Presence of dental bridge    removable - upper  . Stroke [facility]) 2010   due to brain cyst - no residual    Past Surgical History:  Procedure Laterality Date  . BRAIN SURGERY  2010, 2018   brain surgery  . VOCAL CORD LATERALIZATION, ENDOSCOPIC APPROACH W/ MLB      Prior to Admission medications   Medication Sig Start Date End Date Taking? Authorizing Provider  atorvastatin (LIPITOR) 20 MG tablet Take 1 tablet (20 mg total) by mouth daily at 6 PM. 05/13/18  Yes Mikey College, NP  lisinopril-hydrochlorothiazide (PRINZIDE,ZESTORETIC) 10-12.5 MG tablet  04/06/18  Yes [provider]  omeprazole (PRILOSEC) 20 MG capsule Take 1 capsule (20 mg total) by mouth daily. 05/09/18  Yes Mikey College, NP  triamcinolone cream (KENALOG) 0.1 % Apply 1 application topically 2 (two) times daily. Apply to bilateral ears 05/09/18  Yes Mikey College, NP  buPROPion [facility] SR) 150 MG 12 hr tablet Take 1 tablet (150 mg total) by mouth 2 (two) times daily. Patient not taking: Reported on 08/19/2018 08/15/18   Mikey College, NP    Allergies as of 08/23/2018  . (No Known Allergies)    Family History  Problem Relation Age of Onset  . Breast cancer Mother   . Stomach cancer Father   . Thyroid disease Sister   . Esophageal cancer Sister   . Thyroid disease Brother   . Ovarian cancer Sister   . Thyroid disease Sister   . Prostate cancer Brother   . Leukemia Brother     Social History   Socioeconomic History  .  Marital status: Divorced    Spouse name: Not on file  . Number of children: Not on file  . Years of education: Not on file  . Highest education level: High school graduate  Occupational History  . Not on file  Social Needs  . Financial resource strain: Not hard at all  . Food insecurity:    Worry: Never true    Inability: Never true  . Transportation needs:    Medical: No    Non-medical: No  Tobacco Use  . Smoking status: Current Every Day Smoker    Packs/day: 0.25    Years: 45.00    Pack years: 11.25  . Smokeless tobacco: Never Used  . Tobacco comment: down to approx 3 cigs/day  Substance and Sexual Activity  . Alcohol use: Yes    Frequency: Never    Comment: rarely  . Drug use: Never  . Sexual activity: Not on file  Lifestyle  . Physical activity:    Days per week: 7 days    Minutes per session: 60 min  . Stress: Not on file  Relationships  . Social connections:    Talks on phone: Not on file    Gets together: Not on file    Attends religious service: Not on file    Active member of club or organization: Not on file    Attends meetings of clubs  or organizations: Not on file    Relationship status: Not on file  . Intimate partner violence:    Fear of current or ex partner: Not on file    Emotionally abused: Not on file    Physically abused: Not on file    Forced sexual activity: Not on file  Other Topics Concern  . Not on file  Social History Narrative  . Not on file    Review of Systems: See HPI, otherwise negative ROS  Physical Exam: BP 129/80   Pulse 77   Temp 98.1 F (36.7 C) (Temporal)   Resp 17   Ht 4\' 11"  (1.499 m)   Wt 71.7 kg   SpO2 97%   BMI 31.91 kg/m  General:   Alert,  pleasant and cooperative in NAD Head:  Normocephalic and atraumatic. Neck:  Supple; no masses or thyromegaly. Lungs:  Clear throughout to auscultation.    Heart:  Regular rate and rhythm. Abdomen:  Soft, nontender and nondistended. Normal bowel sounds, without  guarding, and without rebound.   Neurologic:  Alert and  oriented x4;  grossly normal neurologically.  Impression/Plan: Ashley Hayes is now here to undergo a screening colonoscopy.  Risks, benefits, and alternatives regarding colonoscopy have been reviewed with the patient.  Questions have been answered.  All parties agreeable.

## 2018-09-06 NOTE — Anesthesia Procedure Notes (Signed)
Date/Time: 09/06/2018 10:25 AM Performed by: Cameron Ali, CRNA Pre-anesthesia Checklist: Patient identified, Emergency Drugs available, Suction available, Timeout performed and Patient being monitored Patient Re-evaluated:Patient Re-evaluated prior to induction Oxygen Delivery Method: Nasal cannula Placement Confirmation: positive ETCO2

## 2018-09-06 NOTE — Op Note (Signed)
[facility] Gastroenterology Patient Name: Ashley Hayes Procedure Date: 09/06/2018 10:15 AM MRN: [id number] Account #: [id number] Date of Birth: Dec 24, 1955 Admit Type: Outpatient Age: 63 Room: [facility] OR ROOM 01 Gender: Female Note Status: Finalized Procedure:            Colonoscopy Indications:          Screening for colorectal malignant neoplasm Providers:            Lucilla Lame MD, MD Referring MD:         Mikey [employer] (Referring MD) Medicines:            Propofol per Anesthesia Complications:        No immediate complications. Procedure:            Pre-Anesthesia Assessment:                       - Prior to the procedure, a History and Physical was                        performed, and patient medications and allergies were                        reviewed. The patient's tolerance of previous                        anesthesia was also reviewed. The risks and benefits of                        the procedure and the sedation options and risks were                        discussed with the patient. All questions were                        answered, and informed consent was obtained. Prior                        Anticoagulants: The patient has taken no previous                        anticoagulant or antiplatelet agents. ASA Grade                        Assessment: II - A patient with mild systemic disease.                        After reviewing the risks and benefits, the patient was                        deemed in satisfactory condition to undergo the                        procedure.                       After obtaining informed consent, the colonoscope was                        passed under direct vision. Throughout the procedure,  the patient's blood pressure, pulse, and oxygen                        saturations were monitored continuously. The was                        introduced through the anus and advanced to the the                    cecum, identified by appendiceal orifice and ileocecal                        valve. The colonoscopy was performed without                        difficulty. The patient tolerated the procedure well.                        The quality of the bowel preparation was excellent. Findings:      The perianal and digital rectal examinations were normal.      A 3 mm polyp was found in the descending colon. The polyp was sessile.       The polyp was removed with a cold biopsy forceps. Resection and       retrieval were complete.      A 6 mm polyp was found in the sigmoid colon. The polyp was sessile. The       polyp was removed with a cold snare. Resection and retrieval were       complete.      A 3 mm polyp was found in the rectum. The polyp was sessile. The polyp       was removed with a cold snare. Resection and retrieval were complete.      A few small-mouthed diverticula were found in the sigmoid colon.      Non-bleeding internal hemorrhoids were found during retroflexion. The       hemorrhoids were Grade II (internal hemorrhoids that prolapse but reduce       spontaneously). Impression:           - One 3 mm polyp in the descending colon, removed with                        a cold biopsy forceps. Resected and retrieved.                       - One 6 mm polyp in the sigmoid colon, removed with a                        cold snare. Resected and retrieved.                       - One 3 mm polyp in the rectum, removed with a cold                        snare. Resected and retrieved.                       - Diverticulosis in the sigmoid colon.                       -  Non-bleeding internal hemorrhoids. Recommendation:       - Discharge patient to home.                       - Resume previous diet.                       - Continue present medications.                       - Await pathology results.                       - Repeat colonoscopy in 5 years if polyp adenoma and 10                         years if hyperplastic Procedure Code(s):    --- Professional ---                       [phone], Colonoscopy, flexible; with removal of tumor(s),                        polyp(s), or other lesion(s) by snare technique                       45380, 15, Colonoscopy, flexible; with biopsy, single                        or multiple Diagnosis Code(s):    --- Professional ---                       Z12.11, Encounter for screening for malignant neoplasm                        of colon                       D12.4, Benign neoplasm of descending colon                       D12.5, Benign neoplasm of sigmoid colon                       K62.1, Rectal polyp CPT copyright 2018 American Medical Association. All rights reserved. The codes documented in this report are preliminary and upon coder review may  be revised to meet current compliance requirements. Lucilla Lame MD, MD 09/06/2018 10:46:06 AM This report has been signed electronically. Number of Addenda: 0 Note Initiated On: 09/06/2018 10:15 AM Scope Withdrawal Time: 0 hours 9 minutes 49 seconds  Total Procedure Duration: 0 hours 14 minutes 6 seconds       [facility]

## 2018-09-06 NOTE — Anesthesia Preprocedure Evaluation (Signed)
Anesthesia Evaluation  Patient identified by MRN, date of birth, ID band Patient awake    Reviewed: Allergy & Precautions, NPO status , Patient's Chart, lab work & pertinent test results  Airway Mallampati: II  TM Distance: >3 FB     Dental   Pulmonary Current Smoker,    breath sounds clear to auscultation       Cardiovascular hypertension,  Rhythm:Regular Rate:Normal  HLD   Neuro/Psych CVA (due to brain cyst, no residual symptoms)    GI/Hepatic GERD  ,  Endo/Other  Obesity - BMI 32  Renal/GU      Musculoskeletal   Abdominal   Peds  Hematology   Anesthesia Other Findings   Reproductive/Obstetrics                             Anesthesia Physical Anesthesia Plan  ASA: III  Anesthesia Plan: General   Post-op Pain Management:    Induction: Intravenous  PONV Risk Score and Plan: Propofol infusion  Airway Management Planned: Natural Airway and Nasal Cannula  Additional Equipment:   Intra-op Plan:   Post-operative Plan:   Informed Consent: I have reviewed the patients History and Physical, chart, labs and discussed the procedure including the risks, benefits and alternatives for the proposed anesthesia with the patient or authorized representative who has indicated his/her understanding and acceptance.       Plan Discussed with: CRNA  Anesthesia Plan Comments:         Anesthesia Quick Evaluation

## 2018-09-09 ENCOUNTER — Encounter: Payer: Self-pay | Admitting: Gastroenterology

## 2018-09-10 ENCOUNTER — Encounter: Payer: Self-pay | Admitting: Gastroenterology

## 2018-09-12 ENCOUNTER — Encounter: Payer: Self-pay | Admitting: Gastroenterology

## 2018-09-17 ENCOUNTER — Other Ambulatory Visit: Payer: Self-pay | Admitting: Neurology

## 2018-09-17 ENCOUNTER — Other Ambulatory Visit (HOSPITAL_COMMUNITY): Payer: Self-pay | Admitting: Neurology

## 2018-09-17 DIAGNOSIS — Z8669 Personal history of other diseases of the nervous system and sense organs: Secondary | ICD-10-CM

## 2018-09-25 ENCOUNTER — Ambulatory Visit
Admission: RE | Admit: 2018-09-25 | Discharge: 2018-09-25 | Disposition: A | Discharge status: Home / Self Care | Payer: BLUE CROSS/BLUE SHIELD | Source: Ambulatory Visit | Attending: Neurology | Admitting: Neurology

## 2018-09-25 DIAGNOSIS — Z8669 Personal history of other diseases of the nervous system and sense organs: Secondary | ICD-10-CM | POA: Insufficient documentation

## 2018-09-27 ENCOUNTER — Ambulatory Visit (INDEPENDENT_AMBULATORY_CARE_PROVIDER_SITE_OTHER): Payer: BLUE CROSS/BLUE SHIELD | Admitting: Nurse Practitioner

## 2018-09-27 VITALS — BP 155/72 | Temp 97.9°F | Ht 59.0 in | Wt 161.6 lb

## 2018-09-27 DIAGNOSIS — R21 Rash and other nonspecific skin eruption: Secondary | ICD-10-CM | POA: Diagnosis not present

## 2018-09-27 DIAGNOSIS — J418 Mixed simple and mucopurulent chronic bronchitis: Secondary | ICD-10-CM

## 2018-09-27 DIAGNOSIS — G930 Cerebral cysts: Secondary | ICD-10-CM

## 2018-09-27 DIAGNOSIS — R7303 Prediabetes: Secondary | ICD-10-CM | POA: Diagnosis not present

## 2018-09-27 DIAGNOSIS — Z716 Tobacco abuse counseling: Secondary | ICD-10-CM | POA: Diagnosis not present

## 2018-09-30 ENCOUNTER — Encounter: Payer: Self-pay | Admitting: Nurse Practitioner

## 2018-09-30 DIAGNOSIS — J418 Mixed simple and mucopurulent chronic bronchitis: Secondary | ICD-10-CM | POA: Insufficient documentation

## 2018-09-30 DIAGNOSIS — R7303 Prediabetes: Secondary | ICD-10-CM | POA: Insufficient documentation

## 2018-09-30 DIAGNOSIS — G930 Cerebral cysts: Secondary | ICD-10-CM | POA: Insufficient documentation

## 2018-09-30 MED ORDER — UMECLIDINIUM-VILANTEROL 62.5-25 MCG/INH IN AEPB: 1.0000 | INHALATION_SPRAY | Freq: Every day | RESPIRATORY_TRACT | 5 refills | Status: DC

## 2018-09-30 NOTE — Progress Notes (Signed)
Subjective:    Patient ID: Ashley Hayes, female    DOB: 1955/10/24, 63 y.o.   MRN: [id number]  Ashley Hayes is a 63 y.o. female presenting on 09/27/2018 for Nicotine Dependence (Cessation) and COPD   HPI Smoking Cessation, COPD Patient presents today for follow-up of smoking cessation with starting Wellbutrin at last visit 08/19/2018.  When taking meds, patient was able to stop smoking completely, but but then had increased cravings after several days.  In the second week of taking medication.  Patient developed progression towards everyone.  She states that she wanted to kill her dog, so she stopped taking medication.  Patient states currently, stopping completely is preventing because she "likes the taste of her cigarettes." -Patient continues to want to quit due to breathlessness.  She gets good relief from this with daily Breo and uses albuterol as needed only with long walks.  Rash - beneath breasts Patient has a chronic rash underneath her breasts.  Notes that currently clotrimazole betamethasone cream has completely healed her rash.  However, she continues to have relapsing remitting rash when stopping medication.  Patient requests refill of this today.  Currently is asymptomatic.  Prediabetes Patient requests clarification of new prediabetes diagnosis documented after last labs from physical.  She is not currently taking medication and does not wish to start medications. - She is not currently symptomatic and denies polydipsia, polyphagia, polyuria, headaches, diaphoresis, shakiness, chills, pain, numbness or tingling in extremities and changes in vision.   - She notes knowledge deficit about how to eat a low-carb diet.  Recent Labs    08/22/18 0914  HGBA1C 6.3*   MRI results Patient also had a recent MRI of her brain for cyst follow-up.  She has had intracranial brain cyst x 2 with removal in past. She does not yet know her results and requests information today.  She is  asymptomatic neurologically today.  Social History   Tobacco Use  . Smoking status: Current Every Day Smoker    Packs/day: 0.25    Years: 45.00    Pack years: 11.25  . Smokeless tobacco: Never Used  . Tobacco comment: down to approx 3 cigs/day  Substance Use Topics  . Alcohol use: Yes    Frequency: Never    Comment: rarely  . Drug use: Never    Review of Systems Per HPI unless specifically indicated above     Objective:    There were no vitals taken for this visit.  Wt Readings from Last 3 Encounters:  09/06/18 158 lb (71.7 kg)  08/19/18 161 lb 6.4 oz (73.2 kg)  08/15/18 161 lb (73 kg)    Physical Exam Vitals signs reviewed.  Constitutional:      General: She is not in acute distress.    Appearance: She is well-developed.  HENT:     Head: Normocephalic and atraumatic.  Cardiovascular:     Rate and Rhythm: Normal rate and regular rhythm.     Pulses:          Radial pulses are 2+ on the right side and 2+ on the left side.       Posterior tibial pulses are 1+ on the right side and 1+ on the left side.     Heart sounds: Normal heart sounds, S1 normal and S2 normal.  Pulmonary:     Effort: Pulmonary effort is normal. No respiratory distress.     Breath sounds: Normal air entry. Examination of the right-lower field reveals rhonchi. Examination of the  left-lower field reveals rhonchi. Decreased breath sounds and rhonchi present. No wheezing or rales.  Musculoskeletal:     Right lower leg: No edema.     Left lower leg: No edema.  Skin:    General: Skin is warm and dry.     Capillary Refill: Capillary refill takes less than 2 seconds.  Neurological:     General: No focal deficit present.     Mental Status: She is alert and oriented to person, place, and time. Mental status is at baseline.     Cranial Nerves: No cranial nerve deficit.     Sensory: No sensory deficit.     Motor: No weakness.     Coordination: Coordination normal.     Gait: Gait normal.     Deep Tendon  Reflexes: Reflexes are normal and symmetric. Reflexes normal.  Psychiatric:        Attention and Perception: Attention normal.        Mood and Affect: Mood and affect normal.        Behavior: Behavior normal. Behavior is cooperative.        Thought Content: Thought content normal.        Judgment: Judgment normal.      Results for orders placed or performed in visit on 08/19/18  COMPLETE METABOLIC PANEL WITH GFR  Result Value Ref Range   Glucose, Bld 105 (H) 65 - 99 mg/dL   BUN 14 7 - 25 mg/dL   Creat 0.79 0.50 - 0.99 mg/dL   GFR, Est Non African American 80 > OR = 60 mL/min/1.15m2   GFR, Est African American 92 > OR = 60 mL/min/1.47m2   BUN/Creatinine Ratio NOT APPLICABLE 6 - 22 (calc)   Sodium 140 135 - 146 mmol/L   Potassium 3.9 3.5 - 5.3 mmol/L   Chloride 104 98 - 110 mmol/L   CO2 28 20 - 32 mmol/L   Calcium 9.2 8.6 - 10.4 mg/dL   Total Protein 6.9 6.1 - 8.1 g/dL   Albumin 3.8 3.6 - 5.1 g/dL   Globulin 3.1 1.9 - 3.7 g/dL (calc)   AG Ratio 1.2 1.0 - 2.5 (calc)   Total Bilirubin 0.4 0.2 - 1.2 mg/dL   Alkaline phosphatase (APISO) 98 33 - 130 U/L   AST 23 10 - 35 U/L   ALT 23 6 - 29 U/L  Lipid panel  Result Value Ref Range   Cholesterol 173 <200 mg/dL   HDL 59 >50 mg/dL   Triglycerides 112 <150 mg/dL   LDL Cholesterol (Calc) 93 mg/dL (calc)   Total CHOL/HDL Ratio 2.9 <5.0 (calc)   Non-HDL Cholesterol (Calc) 114 <130 mg/dL (calc)  TSH  Result Value Ref Range   TSH 3.05 0.40 - 4.50 mIU/L  Hemoglobin A1c  Result Value Ref Range   Hgb A1c MFr Bld 6.3 (H) <5.7 % of total Hgb   Mean Plasma Glucose 134 (calc)   eAG (mmol/L) 7.4 (calc)  CBC with Differential/Platelet  Result Value Ref Range   WBC 6.5 3.8 - 10.8 Thousand/uL   RBC 4.72 3.80 - 5.10 Million/uL   Hemoglobin 13.9 11.7 - 15.5 g/dL   HCT 41.1 35.0 - 45.0 %   MCV 87.1 80.0 - 100.0 fL   MCH 29.4 27.0 - 33.0 pg   MCHC 33.8 32.0 - 36.0 g/dL   RDW 13.0 11.0 - 15.0 %   Platelets 160 140 - 400 Thousand/uL   MPV  11.5 7.5 - 12.5 fL   Neutro Abs 4,115 1,500 -  7,800 cells/uL   Lymphs Abs 1,651 850 - 3,900 cells/uL   Absolute Monocytes 579 200 - 950 cells/uL   Eosinophils Absolute 117 15 - 500 cells/uL   Basophils Absolute 39 0 - 200 cells/uL   Neutrophils Relative % 63.3 %   Total Lymphocyte 25.4 %   Monocytes Relative 8.9 %   Eosinophils Relative 1.8 %   Basophils Relative 0.6 %  HIV Antibody (routine testing w rflx)  Result Value Ref Range   HIV 1&2 Ab, 4th Generation NON-REACTIVE NON-REACTI  Hepatitis C antibody  Result Value Ref Range   Hepatitis C Ab NON-REACTIVE NON-REACTI   SIGNAL TO CUT-OFF 0.04 <1.00  Cytology - PAP  Result Value Ref Range   Adequacy      Satisfactory for evaluation  endocervical/transformation zone component PRESENT.   Diagnosis      NEGATIVE FOR INTRAEPITHELIAL LESIONS OR MALIGNANCY.   HPV NOT DETECTED    Material Submitted CervicoVaginal Pap [ThinPrep Imaged]    CYTOLOGY - PAP PAP RESULT       Assessment & Plan:   Problem List Items Addressed This Visit      Respiratory   Mixed simple and mucopurulent chronic bronchitis (HCC) Stable, but uncontrolled with rhonchi bilateral lower lobes today and regular breathlessness with extended walks.  Symptoms controlled with albuterol as needed.  No  Plan: 1.  Stop Breo. -Start Anoro 1 inhalation daily. 2.  Continue albuterol 1 to 2 puffs every 4 hours as needed 3. PFT full at [facility] 4. Follow-up 3 months      Nervous and Auditory   Brain cyst Reviewed MRI with patient today.  - Confirmed with patient that cyst has recurred.  Reassured patient that she is currently asymptomatic and should continue to monitor symptoms.  Likely will not need urgent intervention. Further information pending from Dr. Melrose Nakayama.    Follow-up prn.     Other   Prediabetes Patient with new prediabetes diagnosis after her annual physical.  Mutual decision to withhold metformin at this time.  Will consider if A1c worsens next  check in 6 months.  Encouraged patient to eat low glycemic diet.  Handout provided.  Follow-up 6 months.    Other Visit Diagnoses    Encounter for smoking cessation counseling    -  Primary Patient with ongoing desire to stop smoking.  Failures in past of Chantix, recent failure Wellbutrin with aggression/agitation. - Discussed using 2 mg nicotine gum prn for smoking cessation.  Unlikely that patches will be successful since current smoking amount is less than 7mg  patch. - Continue substituting deep breathing/other activities prn when cravings occur. - Follow-up for possible Chantix in future.  Discussion today >5 minutes (<10 minutes) specifically on counseling on risks of tobacco use, complications, treatment, smoking cessation.     Rash, skin    - under breasts.  Asymptomatic without rash today. Continue Kenalog topically prn.  START moisture barrier cream for prevention of rash development under breasts. Follow-up prn.      Meds ordered this encounter  Medications  . umeclidinium-vilanterol (ANORO ELLIPTA) 62.5-25 MCG/INH AEPB    Sig: Inhale 1 puff into the lungs daily.    Dispense:  30 each    Refill:  5    Order Specific Question:   Supervising Provider    Answer:   Olin Hauser [[id number]]    Follow up plan: No follow-ups on file.  Cassell Smiles, DNP, AGPCNP-BC Adult Gerontology Primary Care Nurse Practitioner [facility] 09/30/2018, 9:59 AM

## 2018-10-14 ENCOUNTER — Other Ambulatory Visit: Payer: Self-pay | Admitting: Nurse Practitioner

## 2018-10-14 DIAGNOSIS — K219 Gastro-esophageal reflux disease without esophagitis: Secondary | ICD-10-CM

## 2018-11-28 ENCOUNTER — Encounter: Payer: Self-pay | Admitting: Nurse Practitioner

## 2018-11-28 DIAGNOSIS — E559 Vitamin D deficiency, unspecified: Secondary | ICD-10-CM | POA: Insufficient documentation

## 2018-11-28 DIAGNOSIS — R519 Headache, unspecified: Secondary | ICD-10-CM | POA: Insufficient documentation

## 2018-11-28 DIAGNOSIS — R51 Headache: Secondary | ICD-10-CM

## 2018-11-28 DIAGNOSIS — L409 Psoriasis, unspecified: Secondary | ICD-10-CM | POA: Insufficient documentation

## 2018-11-28 DIAGNOSIS — G8929 Other chronic pain: Secondary | ICD-10-CM | POA: Insufficient documentation

## 2018-11-28 DIAGNOSIS — F17200 Nicotine dependence, unspecified, uncomplicated: Secondary | ICD-10-CM | POA: Insufficient documentation

## 2018-11-28 DIAGNOSIS — I10 Essential (primary) hypertension: Secondary | ICD-10-CM | POA: Insufficient documentation

## 2018-12-30 ENCOUNTER — Encounter: Payer: Self-pay | Admitting: Family Medicine

## 2018-12-30 ENCOUNTER — Ambulatory Visit (INDEPENDENT_AMBULATORY_CARE_PROVIDER_SITE_OTHER): Payer: BLUE CROSS/BLUE SHIELD | Admitting: Family Medicine

## 2018-12-30 ENCOUNTER — Other Ambulatory Visit: Payer: Self-pay

## 2018-12-30 ENCOUNTER — Ambulatory Visit: Payer: BLUE CROSS/BLUE SHIELD | Admitting: Nurse Practitioner

## 2018-12-30 DIAGNOSIS — J418 Mixed simple and mucopurulent chronic bronchitis: Secondary | ICD-10-CM

## 2018-12-30 DIAGNOSIS — L308 Other specified dermatitis: Secondary | ICD-10-CM

## 2018-12-30 DIAGNOSIS — F17210 Nicotine dependence, cigarettes, uncomplicated: Secondary | ICD-10-CM | POA: Diagnosis not present

## 2018-12-30 MED ORDER — TRIAMCINOLONE ACETONIDE 0.1 % EX CREA: 1.0000 "application " | TOPICAL_CREAM | Freq: Two times a day (BID) | CUTANEOUS | 1 refills | Status: AC

## 2018-12-30 NOTE — Assessment & Plan Note (Signed)
Active smoke, ready to quit now Prior quit attempts, failed Wellbutrin, Chantix multiple times  Plan Given info for Quitline 1800QUITNOW start with this option to establish protocol for helping her quit and they can offer free or discount products such as nicotine patches NRT - If need new rx she can notify us to send this rx if need, may not be covered  Discussion today >5 minutes (<10 minutes) specifically on counseling on risks of tobacco use, complications, treatment, smoking cessation.

## 2018-12-30 NOTE — Patient Instructions (Addendum)
Refilled Triamcinolone cream for ears  [place] Quitline 1 800-QUIT NOW  Ask for nicotine patches from their service, should be free or low cost discounted, if you need a NEW RX from me I can send this to your pharmacy if you need - just CALL or [place] and let me know.  Nicotine Patches Dosing: Brand Dosage Duration  Habitrol / "Generic nicotine patches" (21,14, or 7mg /day over 24 hours) 21mg / 24 hours 14mg /24 hours 7mg /24 hours 4 weeks then 2 weeks then 2 weeks  Nicoderm CQ  (21, 14, or 7mg /day over 24 hours) Original and Clear 21mg /24 hours 14mg /24 hours 7mg /24 hours 6 weeks then 2 weeks then 2 weeks  *Consider a small initial dosage for light smokers (10 cigarettes daily or less)  Prescribing Instructions: . No smoking while using the patch . Do not remove the patch from its sealed protective pouch until you are ready to apply.  . Place the patch firmly on your skin in a relatively hairless location between the neck and waist. . The "old" patch should be removed daily and the "new" patch should be applied in a different location.  Do not return to a previous site for at least 1 week. . Discard the used patch in a way that prevents children or pets from possible exposure. Wendee Copp your hands with water when you have finished applying. . The nicotine patch will cause a local skin reaction in up to 50% of patients.  Skin reactions are usually mild and are self-limiting, but may worsen over the course of therapy.  Rotating patch sites and local treatment with a topical steroid cream and may improve these reactions. If patient experiences sleep disturbances, remove patch at bedtime    Please schedule a Follow-up Appointment to: Return in about 3 months (around 04/01/2019) for Smoking cessation w/ PCP.  If you have any other questions or concerns, please feel free to call the office or send a message through [place]. You may also schedule an earlier appointment if  necessary.  Additionally, you may be receiving a survey about your experience at our office within a few days to 1 week by e-mail or mail. We value your feedback.  [name], DO [place]

## 2018-12-30 NOTE — Progress Notes (Signed)
Virtual Visit via Telephone The purpose of this virtual visit is to provide medical care while limiting exposure to the novel coronavirus (COVID19) for both patient and office staff.  Consent was obtained for phone visit:  Yes.   Answered questions that patient had about telehealth interaction:  Yes.   I discussed the limitations, risks, security and privacy concerns of performing an evaluation and management service by telephone. I also discussed with the patient that there may be a patient responsible charge related to this service. The patient expressed understanding and agreed to proceed.  Patient Location: Home Provider Location: [facility] (Office)  PCP is Cassell Smiles, AGPCNP-BC - I am currently covering during her maternity leave.   ---------------------------------------------------------------------- Chief Complaint  Patient presents with  . Nicotine Dependence    S: Reviewed CMA documentation. I have called patient and gathered additional HPI as follows:  Smoking NICOTINE DEPENDENCE / Chronic Recurrent Bronchitis COPD - Last visit with PCP on 09/27/18, for initial visit for same problem smoking cessation, treated in past with Chantix x 3 times and Wellbutrin as well most recently, and rx Anoro inhaler maintenance see prior notes for background information. - Interval update most recently failed repeat wellbutrin trial, with nightmares, in past she tried nicotine patches in hospital - Today patient reports that she is now ready to quit smoking, has tried multiple times in past unsuccessful, failed medications as listed above. Did well with nicotine patches in hospital would like to try this. - Has not used Quitline before but would like to try this Denies any depression or panic attacks  Bilateral Pruritic Rash, Ears Improved on topical steroid from PCP Triamcinolone, request refill  Denies any high risk travel to areas of current concern for COVID19. Denies  any known or suspected exposure to person with or possibly with COVID19.  Denies any fevers, chills, sweats, body ache, cough, shortness of breath, sinus pain or pressure, headache, abdominal pain, diarrhea  Past Medical History:  Diagnosis Date  . Allergy   . GERD (gastroesophageal reflux disease)   . Hyperlipidemia   . Hypertension   . Presence of dental bridge    removable - upper  . Stroke [facility]) 2010   due to brain cyst - no residual   Social History   Tobacco Use  . Smoking status: Current Every Day Smoker    Packs/day: 0.25    Years: 45.00    Pack years: 11.25  . Smokeless tobacco: Never Used  . Tobacco comment: down to approx 3-4 cigs/day  Substance Use Topics  . Alcohol use: Yes    Frequency: Never    Comment: rarely  . Drug use: Never    Current Outpatient Medications:  .  atorvastatin (LIPITOR) 20 MG tablet, Take 1 tablet (20 mg total) by mouth daily at 6 PM., Disp: 30 tablet, Rfl: 6 .  lisinopril-hydrochlorothiazide (PRINZIDE,ZESTORETIC) 10-12.5 MG tablet, , Disp: , Rfl: 2 .  omeprazole (PRILOSEC) 20 MG capsule, TAKE 1 CAPSULE BY MOUTH ONCE DAILY, Disp: 30 capsule, Rfl: 3 .  triamcinolone cream (KENALOG) 0.1 %, Apply 1 application topically 2 (two) times daily. Apply to bilateral ears, Disp: 30 g, Rfl: 1 .  umeclidinium-vilanterol (ANORO ELLIPTA) 62.5-25 MCG/INH AEPB, Inhale 1 puff into the lungs daily., Disp: 30 each, Rfl: 5  Depression screen [facility] 2/9 12/30/2018 08/15/2018  Decreased Interest 0 0  Down, Depressed, Hopeless 0 0  PHQ - 2 Score 0 0    No flowsheet data found.  -------------------------------------------------------------------------- O: No  physical exam performed due to remote telephone encounter.  Lab results reviewed.  No results found for this or any previous visit (from the past 2160 hour(s)).  -------------------------------------------------------------------------- A&P:  Problem List Items Addressed This Visit    Mixed simple  and mucopurulent chronic bronchitis (HCC)    Stable chronic bronchitis type of COPD Secondary to chronic smoking history On Anoro inhaler maintenance with improvement Follow-up PRN flare  Smoking cessation see below      Nicotine dependence - Primary    Active smoke, ready to quit now Prior quit attempts, failed Wellbutrin, Chantix multiple times  Plan Given info for Quitline 1800QUITNOW start with this option to establish protocol for helping her quit and they can offer free or discount products such as nicotine patches NRT - If need new rx she can notify us to send this rx if need, may not be covered  Discussion today >5 minutes (<10 minutes) specifically on counseling on risks of tobacco use, complications, treatment, smoking cessation.        Other Visit Diagnoses    Dermatitis associated with moisture       Relevant Medications   triamcinolone cream (KENALOG) 0.1 %      Meds ordered this encounter  Medications  . triamcinolone cream (KENALOG) 0.1 %    Sig: Apply 1 application topically 2 (two) times daily. Apply to bilateral ears    Dispense:  30 g    Refill:  1    Follow-up: - Return in 3 months for Smoking Cessation w/ PCP  Patient verbalizes understanding with the above medical recommendations including the limitation of remote medical advice.  Specific follow-up and call-back criteria were given for patient to follow-up or seek medical care more urgently if needed.   - Time spent in direct consultation with patient on phone: 14 minutes   Ashley Hayes, [facility] 12/30/2018, 9:48 AM

## 2018-12-30 NOTE — Assessment & Plan Note (Signed)
Stable chronic bronchitis type of COPD Secondary to chronic smoking history On Anoro inhaler maintenance with improvement Follow-up PRN flare  Smoking cessation see below

## 2019-01-28 DIAGNOSIS — J418 Mixed simple and mucopurulent chronic bronchitis: Secondary | ICD-10-CM

## 2019-01-28 MED ORDER — BREO ELLIPTA 100-25 MCG/INH IN AEPB: 1.0000 | INHALATION_SPRAY | Freq: Every day | RESPIRATORY_TRACT | 2 refills | Status: DC

## 2019-03-24 ENCOUNTER — Ambulatory Visit (INDEPENDENT_AMBULATORY_CARE_PROVIDER_SITE_OTHER): Payer: BLUE CROSS/BLUE SHIELD | Admitting: Nurse Practitioner

## 2019-03-24 ENCOUNTER — Encounter: Payer: Self-pay | Admitting: Nurse Practitioner

## 2019-03-24 ENCOUNTER — Other Ambulatory Visit: Payer: Self-pay

## 2019-03-24 VITALS — BP 136/60 | HR 72 | Ht 59.0 in | Wt 162.6 lb

## 2019-03-24 DIAGNOSIS — K219 Gastro-esophageal reflux disease without esophagitis: Secondary | ICD-10-CM | POA: Diagnosis not present

## 2019-03-24 DIAGNOSIS — Z716 Tobacco abuse counseling: Secondary | ICD-10-CM | POA: Diagnosis not present

## 2019-03-24 DIAGNOSIS — E785 Hyperlipidemia, unspecified: Secondary | ICD-10-CM

## 2019-03-24 DIAGNOSIS — E1165 Type 2 diabetes mellitus with hyperglycemia: Secondary | ICD-10-CM | POA: Diagnosis not present

## 2019-03-24 LAB — POCT GLYCOSYLATED HEMOGLOBIN (HGB A1C): Hemoglobin A1C: 6.6 % — AB (ref 4.0–5.6)

## 2019-03-24 MED ORDER — BLOOD GLUCOSE METER KIT: PACK | 0 refills | Status: AC

## 2019-03-24 MED ORDER — METFORMIN HCL 500 MG PO TABS: 500.0000 mg | ORAL_TABLET | Freq: Every day | ORAL | 1 refills | Status: DC

## 2019-03-24 NOTE — Patient Instructions (Addendum)
Ashley Hayes,   Thank you for coming in to clinic today.  1. You have Type 2 Diabetes.  2. You should eat at least 2 meals (maybe 3) and 1-2 snacks per day.  3. You should aim for 2-3 carb servings per meal and 1 carb serving per snack.  4. Start metformin 500 mg daily with breakfast.   - Discussed common side effects to include diarrhea, gas.  Please schedule a follow-up appointment with Cassell Smiles, AGNP. Return in about 3 months (around 06/24/2019) for diabetes.  If you have any other questions or concerns, please feel free to call the clinic or send a message through [place]. You may also schedule an earlier appointment if necessary.  You will receive a survey after today's visit either digitally by e-mail or paper by [employer]. Your experiences and feedback matter to [place].  Please respond so we know how we are doing as we provide care for you.   Cassell Smiles, DNP, AGNP-BC Adult Gerontology Nurse Practitioner [facility], [facility]   Type 2 Diabetes Mellitus, Diagnosis, Adult Type 2 diabetes (type 2 diabetes mellitus) is a long-term (chronic) disease. It may be caused by one or both of these problems:  Your pancreas does not make enough of a hormone called insulin.  Your body does not react in a normal way to insulin that it makes. Insulin lets sugars (glucose) go into cells in your body. This gives you energy. If you have type 2 diabetes, sugars cannot get into cells. This causes high blood sugar (hyperglycemia). Your doctor will set treatment goals for you. Generally, you should have these blood sugar levels:  Before meals (preprandial): 80-130 mg/dL (4.4-7.2 mmol/L).  After meals (postprandial): below 180 mg/dL (10 mmol/L).  A1c (hemoglobin A1c) level: less than 7%. Follow these instructions at home: Questions to ask your doctor  You may want to ask these questions: ? Do I need to meet with a diabetes educator? ? Where can I find a support group for  people with diabetes? ? What equipment will I need to care for myself at home? ? What diabetes medicines do I need? When should I take them? ? How often do I need to check my blood sugar? ? What number can I call if I have questions? ? When is my next doctor's visit? General instructions  Take over-the-counter and prescription medicines only as told by your doctor.  Keep all follow-up visits as told by your doctor. This is important. Contact a doctor if:  Your blood sugar is at or above 240 mg/dL (13.3 mmol/L) for 2 days in a row.  You have been sick for 2 days or more, and you are not getting better.  You have had a fever for 2 days or more, and you are not getting better.  You have any of these problems for more than 6 hours: ? You cannot eat or drink. ? You feel sick to your stomach (nauseous). ? You throw up (vomit). ? You have watery poop (diarrhea). Get help right away if:  Your blood sugar is lower than 54 mg/dL (3 mmol/L).  You get confused.  You have trouble: ? Thinking clearly. ? Breathing.  You have moderate or large ketone levels in your pee (urine). Summary  Type 2 diabetes is a long-term (chronic) disease. Your pancreas may not make enough of a hormone called insulin, or your body may not react normally to insulin that it makes.  Take over-the-counter and prescription medicines  only as told by your doctor.  Keep all follow-up visits as told by your doctor. This is important. This information is not intended to replace advice given to you by your health care provider. Make sure you discuss any questions you have with your health care provider. Document Released: 05/16/2008 Document Revised: 10/05/2017 Document Reviewed: 09/10/2015 Elsevier Patient Education  2020 [place] for Diabetes Mellitus, Adult  Carbohydrate counting is a method of keeping track of how many carbohydrates you eat. Eating carbohydrates naturally increases  the amount of sugar (glucose) in the blood. Counting how many carbohydrates you eat helps keep your blood glucose within normal limits, which helps you manage your diabetes (diabetes mellitus). It is important to know how many carbohydrates you can safely have in each meal. This is different for every person. A diet and nutrition specialist (registered dietitian) can help you make a meal plan and calculate how many carbohydrates you should have at each meal and snack. Carbohydrates are found in the following foods:  Grains, such as breads and cereals.  Dried beans and soy products.  Starchy vegetables, such as potatoes, peas, and corn.  Fruit and fruit juices.  Milk and yogurt.  Sweets and snack foods, such as cake, cookies, candy, chips, and soft drinks. How do I count carbohydrates? There are two ways to count carbohydrates in food. You can use either of the methods or a combination of both. Reading "Nutrition Facts" on packaged food The "Nutrition Facts" list is included on the labels of almost all packaged foods and beverages in the [place] It includes:  The serving size.  Information about nutrients in each serving, including the grams (g) of carbohydrate per serving. To use the "Nutrition Facts":  Decide how many servings you will have.  Multiply the number of servings by the number of carbohydrates per serving.  The resulting number is the total amount of carbohydrates that you will be having. Learning standard serving sizes of other foods When you eat carbohydrate foods that are not packaged or do not include "Nutrition Facts" on the label, you need to measure the servings in order to count the amount of carbohydrates:  Measure the foods that you will eat with a food scale or measuring cup, if needed.  Decide how many standard-size servings you will eat.  Multiply the number of servings by 15. Most carbohydrate-rich foods have about 15 g of carbohydrates per serving. ? For  example, if you eat 8 oz (170 g) of strawberries, you will have eaten 2 servings and 30 g of carbohydrates (2 servings x 15 g = 30 g).  For foods that have more than one food mixed, such as soups and casseroles, you must count the carbohydrates in each food that is included. The following list contains standard serving sizes of common carbohydrate-rich foods. Each of these servings has about 15 g of carbohydrates:   hamburger bun or  English muffin.   oz (15 mL) syrup.   oz (14 g) jelly.  1 slice of bread.  1 six-inch tortilla.  3 oz (85 g) cooked rice or pasta.  4 oz (113 g) cooked dried beans.  4 oz (113 g) starchy vegetable, such as peas, corn, or potatoes.  4 oz (113 g) hot cereal.  4 oz (113 g) mashed potatoes or  of a large baked potato.  4 oz (113 g) canned or frozen fruit.  4 oz (120 mL) fruit juice.  4-6 crackers.  6 chicken nuggets.  6 oz (170 g) unsweetened dry cereal.  6 oz (170 g) plain fat-free yogurt or yogurt sweetened with artificial sweeteners.  8 oz (240 mL) milk.  8 oz (170 g) fresh fruit or one small piece of fruit.  24 oz (680 g) popped popcorn. Example of carbohydrate counting Sample meal  3 oz (85 g) chicken breast.  6 oz (170 g) brown rice.  4 oz (113 g) corn.  8 oz (240 mL) milk.  8 oz (170 g) strawberries with sugar-free whipped topping. Carbohydrate calculation 1. Identify the foods that contain carbohydrates: ? Rice. ? Corn. ? Milk. ? Strawberries. 2. Calculate how many servings you have of each food: ? 2 servings rice. ? 1 serving corn. ? 1 serving milk. ? 1 serving strawberries. 3. Multiply each number of servings by 15 g: ? 2 servings rice x 15 g = 30 g. ? 1 serving corn x 15 g = 15 g. ? 1 serving milk x 15 g = 15 g. ? 1 serving strawberries x 15 g = 15 g. 4. Add together all of the amounts to find the total grams of carbohydrates eaten: ? 30 g + 15 g + 15 g + 15 g = 75 g of carbohydrates  total. Summary  Carbohydrate counting is a method of keeping track of how many carbohydrates you eat.  Eating carbohydrates naturally increases the amount of sugar (glucose) in the blood.  Counting how many carbohydrates you eat helps keep your blood glucose within normal limits, which helps you manage your diabetes.  A diet and nutrition specialist (registered dietitian) can help you make a meal plan and calculate how many carbohydrates you should have at each meal and snack. This information is not intended to replace advice given to you by your health care provider. Make sure you discuss any questions you have with your health care provider. Document Released: 08/07/2005 Document Revised: 03/01/2017 Document Reviewed: 01/19/2016 Elsevier Patient Education  2020 [employer].

## 2019-03-24 NOTE — Progress Notes (Signed)
Subjective:    Patient ID: Ashley Hayes, female    DOB: 17-Sep-1955, 63 y.o.   MRN: [id number]  Ashley Hayes is a 63 y.o. female presenting on 03/24/2019 for Gastroesophageal Reflux (smoking cessation)   HPI GERD Patient takes omeprazole 20 mg every other day.  She reports no n/v, coffee ground emesis, dark/black/tarry stool, BRBPR, or other GI bleeding.  Pre-Diabetes Pt presents today for follow up of prediabetes. She is not checking CBG at home - Current diabetic medications include: none - She is currently symptomatic with polyphagia, polyuria, polydipsia. - She denies headaches, diaphoresis, shakiness, chills, pain, numbness or tingling in extremities and changes in vision.   - Clinical course has been unchanged. - She  reports no regular exercise routine. - Her diet is moderate in salt, moderate in fat, and moderate in carbohydrates. - Weight trend: increasing steadily  PREVENTION: Eye exam current (within one year): no Foot exam current (within one year): no  Lipid/ASCVD risk reduction - on statin: yes Kidney protection - on ace or arb: yes Recent Labs    08/22/18 0914 03/24/19 0908  HGBA1C 6.3* 6.6*    Smoking Cessation Patient has cut down to 2 cigarettes per day. She is using nicotine lozenges up to 3 times daily for nicotine replacement.   Social History   Tobacco Use  . Smoking status: Current Every Day Smoker    Packs/day: 0.10    Years: 45.00    Pack years: 4.50  . Smokeless tobacco: Never Used  . Tobacco comment: down to approx 2 cigs/day  Substance Use Topics  . Alcohol use: Yes    Frequency: Never    Comment: rarely  . Drug use: Never    Review of Systems Per HPI unless specifically indicated above     Objective:    BP 136/60 (BP Location: Left Arm, Patient Position: Sitting, Cuff Size: Normal)   Pulse 72   Ht 4' 11"  (1.499 m)   Wt 162 lb 9.6 oz (73.8 kg)   SpO2 100%   BMI 32.84 kg/m   Wt Readings from Last 3 Encounters:  03/24/19 162  lb 9.6 oz (73.8 kg)  09/27/18 161 lb 9.6 oz (73.3 kg)  09/06/18 158 lb (71.7 kg)    Physical Exam Vitals signs reviewed.  Constitutional:      General: She is awake. She is not in acute distress.    Appearance: Normal appearance. She is well-developed. She is obese.  HENT:     Head: Normocephalic and atraumatic.  Neck:     Musculoskeletal: Normal range of motion and neck supple.     Vascular: No carotid bruit.  Cardiovascular:     Rate and Rhythm: Normal rate and regular rhythm.     Pulses:          Radial pulses are 2+ on the right side and 2+ on the left side.       Posterior tibial pulses are 1+ on the right side and 1+ on the left side.     Heart sounds: Normal heart sounds, S1 normal and S2 normal.  Pulmonary:     Effort: Pulmonary effort is normal. No respiratory distress.     Breath sounds: Decreased air movement present. Decreased breath sounds, wheezing and rhonchi present.  Skin:    General: Skin is warm and dry.  Neurological:     General: No focal deficit present.     Mental Status: She is alert and oriented to person, place, and time. Mental status  is at baseline.  Psychiatric:        Attention and Perception: Attention normal.        Mood and Affect: Mood and affect normal.        Behavior: Behavior normal. Behavior is cooperative.        Thought Content: Thought content normal.        Judgment: Judgment normal.    Results for orders placed or performed in visit on 03/24/19  Lipid panel  Result Value Ref Range   Cholesterol 156 <200 mg/dL   HDL 53 > OR = 50 mg/dL   Triglycerides 108 <150 mg/dL   LDL Cholesterol (Calc) 83 mg/dL (calc)   Total CHOL/HDL Ratio 2.9 <5.0 (calc)   Non-HDL Cholesterol (Calc) 103 <130 mg/dL (calc)  COMPLETE METABOLIC PANEL WITH GFR  Result Value Ref Range   Glucose, Bld 104 (H) 65 - 99 mg/dL   BUN 14 7 - 25 mg/dL   Creat 0.87 0.50 - 0.99 mg/dL   GFR, Est Non African American 71 > OR = 60 mL/min/1.32m   GFR, Est African  American 82 > OR = 60 mL/min/1.762m  BUN/Creatinine Ratio NOT APPLICABLE 6 - 22 (calc)   Sodium 143 135 - 146 mmol/L   Potassium 4.6 3.5 - 5.3 mmol/L   Chloride 105 98 - 110 mmol/L   CO2 27 20 - 32 mmol/L   Calcium 10.1 8.6 - 10.4 mg/dL   Total Protein 7.6 6.1 - 8.1 g/dL   Albumin 4.2 3.6 - 5.1 g/dL   Globulin 3.4 1.9 - 3.7 g/dL (calc)   AG Ratio 1.2 1.0 - 2.5 (calc)   Total Bilirubin 0.5 0.2 - 1.2 mg/dL   Alkaline phosphatase (APISO) 86 37 - 153 U/L   AST 21 10 - 35 U/L   ALT 16 6 - 29 U/L  POCT glycosylated hemoglobin (Hb A1C)  Result Value Ref Range   Hemoglobin A1C 6.6 (A) 4.0 - 5.6 %   HbA1c POC (<> result, manual entry)     HbA1c, POC (prediabetic range)     HbA1c, POC (controlled diabetic range)        Assessment & Plan:   Problem List Items Addressed This Visit      Other   Type 2 Diabetes with hyperglycemia- Primary New diagnosis T2DM with A1c at goal, but worsening from prior prediabetes status. Currently complicated with symptomatic hyperglycemia (polyuria, polydipsia, polyphagia) and weight gain.  Plan: 1. START metformin 500 mg daily with breakfast 2. Start checking morning CBGs 3. Provided low glycemic diet handout 4. Recommend eye exam and foot care regularly. 5. Follow-up 3 months.   Relevant Medications   metFORMIN (GLUCOPHAGE) 500 MG tablet   blood glucose meter kit and supplies    Other Visit Diagnoses    Dyslipidemia     Stable without ASCVD events.  New T2DM and need for LDL goal < 70.   Plan: 1. Continue on statin. 2. Need labs today 3. Encouraged healthy lifestyle 4. Follow-up 3 months.   Relevant Orders   Lipid panel (Completed)   COMPLETE METABOLIC PANEL WITH GFR (Completed)   Encounter for smoking cessation counseling     Patient continues to reduce cigarettes.  Is using nicotine replacement occasionally.  Continue efforts to reduce. May trial Wellbutrin again.  Focus on lifestyle changes for DM recommended over next 4-6 weeks before  adding smoking cessation stressor.  Educated patient that she should completely quit nicotine use to improve heart health in future.  FOLLOW-UP 3 months.  Discussion today >5 minutes (<10 minutes) specifically on counseling on risks of tobacco use, complications, treatment, smoking cessation as above.    Gastroesophageal reflux disease, esophagitis presence not specified     Currently well controlled on omeprazole 20 mg once daily.  Plan: 1. Continue omeprazole 20 mg once daily. Side effects discussed. Pt wants to continue med. 2. Avoid diet triggers. Reviewed need to seek care if globus sensation, difficulty swallowing, s/sx of GI bleed. 3. Follow up as needed and in 3 months.       Meds ordered this encounter  Medications  . metFORMIN (GLUCOPHAGE) 500 MG tablet    Sig: Take 1 tablet (500 mg total) by mouth daily with breakfast.    Dispense:  90 tablet    Refill:  1    Order Specific Question:   Supervising Provider    Answer:   Olin Hauser [[id number]]  . blood glucose meter kit and supplies    Sig: Dispense based on patient and insurance preference. Use once daily as directed. (FOR ICD-9 250.00, 250.01).    Dispense:  1 each    Refill:  0    Order Specific Question:   Supervising Provider    Answer:   Olin Hauser [[id number]]    Order Specific Question:   Number of strips    Answer:   100    Order Specific Question:   Number of lancets    Answer:   100    Follow up plan: Return in about 3 months (around 06/24/2019) for diabetes.  Cassell Smiles, DNP, AGPCNP-BC Adult Gerontology Primary Care Nurse Practitioner [facility] 03/24/2019, 8:48 AM

## 2019-03-25 LAB — LIPID PANEL
Cholesterol: 156 mg/dL (ref <200)
HDL: 53 mg/dL (ref >=50)
LDL Cholesterol (Calc): 83 mg/dL (calc)
Non-HDL Cholesterol (Calc): 103 mg/dL (calc) (ref <130)
Total CHOL/HDL Ratio: 2.9 (calc) (ref <5.0)
Triglycerides: 108 mg/dL (ref <150)

## 2019-03-25 LAB — COMPLETE METABOLIC PANEL WITH GFR
AG Ratio: 1.2 (calc) (ref 1.0–2.5)
ALT: 16 U/L (ref 6–29)
AST: 21 U/L (ref 10–35)
Albumin: 4.2 g/dL (ref 3.6–5.1)
Alkaline phosphatase (APISO): 86 U/L (ref 37–153)
BUN: 14 mg/dL (ref 7–25)
CO2: 27 mmol/L (ref 20–32)
Calcium: 10.1 mg/dL (ref 8.6–10.4)
Chloride: 105 mmol/L (ref 98–110)
Creat: 0.87 mg/dL (ref 0.50–0.99)
GFR, Est African American: 82 mL/min/{1.73_m2} (ref >=60)
GFR, Est Non African American: 71 mL/min/{1.73_m2} (ref >=60)
Globulin: 3.4 g/dL (calc) (ref 1.9–3.7)
Glucose, Bld: 104 mg/dL — ABNORMAL HIGH (ref 65–99)
Potassium: 4.6 mmol/L (ref 3.5–5.3)
Sodium: 143 mmol/L (ref 135–146)
Total Bilirubin: 0.5 mg/dL (ref 0.2–1.2)
Total Protein: 7.6 g/dL (ref 6.1–8.1)

## 2019-03-26 ENCOUNTER — Encounter: Payer: Self-pay | Admitting: Nurse Practitioner

## 2019-05-01 ENCOUNTER — Encounter: Payer: Self-pay | Admitting: Nurse Practitioner

## 2019-05-01 MED ORDER — LISINOPRIL-HYDROCHLOROTHIAZIDE 10-12.5 MG PO TABS: 1.0000 | ORAL_TABLET | Freq: Every day | ORAL | 2 refills | Status: DC

## 2019-05-02 ENCOUNTER — Encounter: Payer: Self-pay | Admitting: Nurse Practitioner

## 2019-05-02 DIAGNOSIS — I10 Essential (primary) hypertension: Secondary | ICD-10-CM

## 2019-05-02 MED ORDER — LISINOPRIL-HYDROCHLOROTHIAZIDE 10-12.5 MG PO TABS: 1.0000 | ORAL_TABLET | Freq: Every day | ORAL | 2 refills | Status: DC

## 2019-05-12 LAB — HM MAMMOGRAPHY

## 2019-05-23 ENCOUNTER — Telehealth: Payer: Self-pay

## 2019-05-23 NOTE — Telephone Encounter (Signed)
Cloyde Reams called from [facility] about the patient recent Diagnostic Mammogram Result. She states that the mammogram showed increase density on Lt breast at 12 o'clock and that the Radiologist is recommending a 3D Stereotactic Guided Breast Biopsy. There is currently only 2 location that does the 3D Stereotactic Biopsy [facility]. If you decide to place the order for [facility] in [place], [place] the fax number is [phone]. Cloyde Reams said the radiologist stated that it could be 2D Stereotactic if the insurance doesn't cover it, but 3D is the recommendation.

## 2019-05-23 NOTE — Telephone Encounter (Signed)
I have written the order on Rx pad.  Please see if Lexine Baton can assist with this referral for imaging and insurance coverage.  If not, please fax to [facility] in [place]

## 2019-05-26 ENCOUNTER — Telehealth: Payer: Self-pay | Admitting: Nurse Practitioner

## 2019-05-26 DIAGNOSIS — R928 Other abnormal and inconclusive findings on diagnostic imaging of breast: Secondary | ICD-10-CM

## 2019-05-26 NOTE — Telephone Encounter (Signed)
Pt stopped by requesting her referral  (for Biopsy) she wanted it at [facility]  they're in network. Pt call back  # [phone]

## 2019-05-26 NOTE — Telephone Encounter (Signed)
I have placed an order.  [place] will call to schedule. Patient will need to sign release of records for comparison for images to be sent to [facility].

## 2019-06-01 ENCOUNTER — Encounter: Payer: Self-pay | Admitting: Nurse Practitioner

## 2019-06-01 DIAGNOSIS — E7800 Pure hypercholesterolemia, unspecified: Secondary | ICD-10-CM

## 2019-06-01 DIAGNOSIS — I10 Essential (primary) hypertension: Secondary | ICD-10-CM

## 2019-06-01 DIAGNOSIS — K219 Gastro-esophageal reflux disease without esophagitis: Secondary | ICD-10-CM

## 2019-06-02 MED ORDER — OMEPRAZOLE 20 MG PO CPDR: 20.0000 mg | DELAYED_RELEASE_CAPSULE | Freq: Every day | ORAL | 2 refills | Status: DC

## 2019-06-02 MED ORDER — ATORVASTATIN CALCIUM 20 MG PO TABS: 20.0000 mg | ORAL_TABLET | Freq: Every day | ORAL | 2 refills | Status: DC

## 2019-06-02 MED ORDER — LISINOPRIL-HYDROCHLOROTHIAZIDE 10-12.5 MG PO TABS: 1.0000 | ORAL_TABLET | Freq: Every day | ORAL | 2 refills | Status: DC

## 2019-06-03 NOTE — Telephone Encounter (Signed)
I attempted to contact [facility] to f/u on the Sterotactic Biopsy. No answer. I left a detial message on the pt vm to return my call.

## 2019-06-04 ENCOUNTER — Telehealth: Payer: Self-pay

## 2019-06-04 NOTE — Telephone Encounter (Signed)
The pt confirmed that she received my message on her voicemail to contact [facility] needing her to sign a waiver for them to be able to retreive her last diagnostic  Mammogram. She contacted them and is planning on going to [place] today to sign the waiver.

## 2019-06-10 ENCOUNTER — Other Ambulatory Visit: Payer: Self-pay | Admitting: Nurse Practitioner

## 2019-06-10 DIAGNOSIS — R928 Other abnormal and inconclusive findings on diagnostic imaging of breast: Secondary | ICD-10-CM

## 2019-06-17 ENCOUNTER — Other Ambulatory Visit: Payer: Self-pay | Admitting: Nurse Practitioner

## 2019-06-17 ENCOUNTER — Ambulatory Visit
Admission: RE | Admit: 2019-06-17 | Discharge: 2019-06-17 | Disposition: A | Discharge status: Home / Self Care | Payer: BLUE CROSS/BLUE SHIELD | Source: Ambulatory Visit | Attending: Nurse Practitioner | Admitting: Nurse Practitioner

## 2019-06-17 DIAGNOSIS — R928 Other abnormal and inconclusive findings on diagnostic imaging of breast: Secondary | ICD-10-CM | POA: Insufficient documentation

## 2019-06-24 ENCOUNTER — Other Ambulatory Visit: Payer: Self-pay | Admitting: Nurse Practitioner

## 2019-06-24 DIAGNOSIS — E1165 Type 2 diabetes mellitus with hyperglycemia: Secondary | ICD-10-CM

## 2019-06-25 ENCOUNTER — Ambulatory Visit: Payer: BLUE CROSS/BLUE SHIELD | Admitting: Nurse Practitioner

## 2019-06-25 ENCOUNTER — Ambulatory Visit (INDEPENDENT_AMBULATORY_CARE_PROVIDER_SITE_OTHER): Payer: BLUE CROSS/BLUE SHIELD | Admitting: Family Medicine

## 2019-06-25 ENCOUNTER — Other Ambulatory Visit: Payer: Self-pay

## 2019-06-25 ENCOUNTER — Encounter: Payer: Self-pay | Admitting: Family Medicine

## 2019-06-25 VITALS — BP 136/62 | HR 67 | Temp 98.2°F | Resp 16 | Ht 59.0 in | Wt 145.6 lb

## 2019-06-25 DIAGNOSIS — R7303 Prediabetes: Secondary | ICD-10-CM | POA: Diagnosis not present

## 2019-06-25 DIAGNOSIS — Z23 Encounter for immunization: Secondary | ICD-10-CM | POA: Diagnosis not present

## 2019-06-25 DIAGNOSIS — E663 Overweight: Secondary | ICD-10-CM | POA: Diagnosis not present

## 2019-06-25 LAB — POCT GLYCOSYLATED HEMOGLOBIN (HGB A1C): Hemoglobin A1C: 6 % — AB (ref 4.0–5.6)

## 2019-06-25 NOTE — Progress Notes (Signed)
Subjective:    Patient ID: Ashley Hayes, female    DOB: Nov 02, 1955, 63 y.o.   MRN: [id number]  Ashley Hayes is a 63 y.o. female presenting on 06/25/2019 for Pre-Diabetes  Previous PCP Cassell Smiles, AGPCNP-BC   HPI   Pre-Diabetes / Overweight BMI >29 Last visit with PCP 03/2019, she had elevated A1c to 6.6, she was concerned about type 2 diabetes, started on Metformin and lifestyle overhaul. - Interval update - significant weight loss with diet and exercise regimen. Down from 162 lbs to 145 lbs and feels good overall improvement CBGs: Has glucometer, not checking regularly, asking about this Meds: Metformin IR 500mg  daily wc Reports good compliance. Tolerating well w/o side-effects Currently on ACEi  Lifestyle: - Diet (improved DM diet)  - Exercise (improving exercise regimen now) Denies hypoglycemia, polyuria, visual changes, numbness or tingling.  Health Maintenance: Due for Flu Shot, will receive today    Depression screen [facility] 2/9 06/25/2019 12/30/2018 08/15/2018  Decreased Interest 0 0 0  Down, Depressed, Hopeless 0 0 0  PHQ - 2 Score 0 0 0    Social History   Tobacco Use  . Smoking status: Current Every Day Smoker    Packs/day: 0.10    Years: 45.00    Pack years: 4.50  . Smokeless tobacco: Never Used  . Tobacco comment: down to approx 2 cigs/day  Substance Use Topics  . Alcohol use: Yes    Frequency: Never    Comment: rarely  . Drug use: Never    Review of Systems Per HPI unless specifically indicated above     Objective:    BP 136/62   Pulse 67   Temp 98.2 F (36.8 C) (Oral)   Resp 16   Ht 4\' 11"  (1.499 m)   Wt 145 lb 9.6 oz (66 kg)   BMI 29.41 kg/m   Wt Readings from Last 3 Encounters:  06/25/19 145 lb 9.6 oz (66 kg)  03/24/19 162 lb 9.6 oz (73.8 kg)  09/27/18 161 lb 9.6 oz (73.3 kg)    Physical Exam Vitals signs and nursing note reviewed.  Constitutional:      General: She is not in acute distress.    Appearance: She is well-developed.  She is not diaphoretic.     Comments: Well-appearing, comfortable, cooperative  HENT:     Head: Normocephalic and atraumatic.  Eyes:     General:        Right eye: No discharge.        Left eye: No discharge.     Conjunctiva/sclera: Conjunctivae normal.  Neck:     Musculoskeletal: Normal range of motion and neck supple.     Thyroid: No thyromegaly.  Cardiovascular:     Rate and Rhythm: Normal rate and regular rhythm.     Heart sounds: Normal heart sounds. No murmur.  Pulmonary:     Effort: Pulmonary effort is normal. No respiratory distress.     Breath sounds: Normal breath sounds. No wheezing or rales.  Musculoskeletal: Normal range of motion.  Lymphadenopathy:     Cervical: No cervical adenopathy.  Skin:    General: Skin is warm and dry.     Findings: No erythema or rash.  Neurological:     Mental Status: She is alert and oriented to person, place, and time.  Psychiatric:        Behavior: Behavior normal.     Comments: Well groomed, good eye contact, normal speech and thoughts    Results for orders placed  or performed in visit on 06/25/19  POCT HgB A1C  Result Value Ref Range   Hemoglobin A1C 6.0 (A) 4.0 - 5.6 %      Assessment & Plan:   Problem List Items Addressed This Visit    Prediabetes - Primary    Significantly improved PreDM control A1c from 6.6 down to 6.0, not consistent with a new diagnosis of type 2 diabetes at this time. She is on metformin therapy and lifestyle overhaul with excellent results wt loss  Plan:  1. Continue current therapy - Metformin IR 500mg  daily - continue for now, has refills, can keep on med for another 4-6 months then reconsider if need anymore since improved lifestyle 2. Encourage improved lifestyle - low carb, low sugar diet, reduce portion size, continue improving regular exercise 3. Follow-up 4 months PreDM A1c with new provider       Relevant Orders   POCT HgB A1C (Completed)   Overweight (BMI 25.0-29.9)    Other Visit  Diagnoses    Needs flu shot       Relevant Orders   Flu Vaccine QUAD 36+ mos IM (Completed)      No orders of the defined types were placed in this encounter.     Follow up plan: Return in about 4 months (around 10/23/2019) for 4 month PreDM A1c, meet new provider.   [name], [facility] 06/25/2019, 11:44 AM

## 2019-06-25 NOTE — Assessment & Plan Note (Signed)
Significantly improved PreDM control A1c from 6.6 down to 6.0, not consistent with a new diagnosis of type 2 diabetes at this time. She is on metformin therapy and lifestyle overhaul with excellent results wt loss  Plan:  1. Continue current therapy - Metformin IR 500mg  daily - continue for now, has refills, can keep on med for another 4-6 months then reconsider if need anymore since improved lifestyle 2. Encourage improved lifestyle - low carb, low sugar diet, reduce portion size, continue improving regular exercise 3. Follow-up 4 months PreDM A1c with new provider

## 2019-06-25 NOTE — Patient Instructions (Addendum)
Thank you for coming to the office today.  Flu shot today  Keep up the great work on lifestyle  If you feel light headed shaky or issue low sugar, check sugar reading, try to check variety, fasting in AM, bedtime, 2 hour after meal, keep track. Not required to check every day.  Let me know when ready for refill  Keep on Metformin 500mg  once daily with meal, you are considered PRE Diabetic still at this point.  Recent Labs    08/22/18 0914 03/24/19 0908 06/25/19 1138  HGBA1C 6.3* 6.6* 6.0*     Please schedule a Follow-up Appointment to: Return in about 4 months (around 10/23/2019) for 4 month PreDM A1c, meet new provider.  If you have any other questions or concerns, please feel free to call the office or send a message through [place]. You may also schedule an earlier appointment if necessary.  Additionally, you may be receiving a survey about your experience at our office within a few days to 1 week by e-mail or mail. We value your feedback.  [name], DO [place]

## 2019-07-29 ENCOUNTER — Other Ambulatory Visit: Payer: Self-pay | Admitting: Nurse Practitioner

## 2019-07-29 DIAGNOSIS — E7800 Pure hypercholesterolemia, unspecified: Secondary | ICD-10-CM

## 2019-08-08 ENCOUNTER — Other Ambulatory Visit: Payer: Self-pay | Admitting: Family Medicine

## 2019-08-08 ENCOUNTER — Other Ambulatory Visit: Payer: Self-pay | Admitting: Nurse Practitioner

## 2019-08-08 DIAGNOSIS — J418 Mixed simple and mucopurulent chronic bronchitis: Secondary | ICD-10-CM

## 2019-08-08 DIAGNOSIS — K219 Gastro-esophageal reflux disease without esophagitis: Secondary | ICD-10-CM

## 2019-09-08 ENCOUNTER — Other Ambulatory Visit: Payer: Self-pay | Admitting: Family Medicine

## 2019-09-08 DIAGNOSIS — K219 Gastro-esophageal reflux disease without esophagitis: Secondary | ICD-10-CM

## 2019-09-30 DIAGNOSIS — E1165 Type 2 diabetes mellitus with hyperglycemia: Secondary | ICD-10-CM

## 2019-09-30 MED ORDER — METFORMIN HCL 500 MG PO TABS: 500.0000 mg | ORAL_TABLET | Freq: Every day | ORAL | 1 refills | Status: AC

## 2019-10-18 ENCOUNTER — Other Ambulatory Visit: Payer: Self-pay | Admitting: Family Medicine

## 2019-10-18 DIAGNOSIS — K219 Gastro-esophageal reflux disease without esophagitis: Secondary | ICD-10-CM

## 2019-10-20 ENCOUNTER — Other Ambulatory Visit: Payer: Self-pay | Admitting: Nurse Practitioner

## 2019-10-20 DIAGNOSIS — K219 Gastro-esophageal reflux disease without esophagitis: Secondary | ICD-10-CM

## 2019-10-20 MED ORDER — OMEPRAZOLE 20 MG PO CPDR: 20.0000 mg | DELAYED_RELEASE_CAPSULE | Freq: Every day | ORAL | 0 refills | Status: AC

## 2019-10-23 ENCOUNTER — Ambulatory Visit: Payer: Self-pay | Admitting: Family Medicine

## 2019-10-23 ENCOUNTER — Other Ambulatory Visit: Payer: Self-pay

## 2019-12-10 ENCOUNTER — Other Ambulatory Visit: Payer: Self-pay | Admitting: Nurse Practitioner

## 2019-12-10 ENCOUNTER — Other Ambulatory Visit: Payer: Self-pay | Admitting: Family Medicine

## 2019-12-10 ENCOUNTER — Telehealth: Payer: Self-pay | Admitting: Family Medicine

## 2019-12-10 DIAGNOSIS — I10 Essential (primary) hypertension: Secondary | ICD-10-CM

## 2019-12-10 DIAGNOSIS — E7800 Pure hypercholesterolemia, unspecified: Secondary | ICD-10-CM

## 2019-12-10 DIAGNOSIS — J418 Mixed simple and mucopurulent chronic bronchitis: Secondary | ICD-10-CM

## 2019-12-10 NOTE — Telephone Encounter (Signed)
Requested medication (s) are due for refill today: yes  Requested medication (s) are on the active medication list: yes  Last refill:  09/08/2019  Future visit scheduled: yes  Notes to clinic:  medication was refilled by a different provider  Review for refill   Requested Prescriptions  Pending Prescriptions Disp Refills   BREO ELLIPTA 100-25 MCG/INH AEPB [Pharmacy Med Name: BREO ELLIPTA 100-25 MCG/INH INH AEP] 68 each 2    Sig: USE 1 PUFF INTO THE LUNGS ONCE DAILY AS DIRECTED      Pulmonology:  Combination Products Passed - 12/10/2019 11:42 AM      Passed - Valid encounter within last 12 months    Recent Outpatient Visits           5 months ago Prediabetes   Hundred, DO   8 months ago Type 2 diabetes mellitus with hyperglycemia, without long-term current use of insulin [facility])   [facility] Mikey College, NP   11 months ago Cigarette nicotine dependence without complication   [facility] Olin Hauser, DO   1 year ago Encounter for smoking cessation counseling   [facility] Merrilyn Puma, Jerrel Ivory, NP   1 year ago Encounter for annual physical exam   [facility] Merrilyn Puma, Jerrel Ivory, NP

## 2019-12-10 NOTE — Telephone Encounter (Signed)
Requested Prescriptions  Pending Prescriptions Disp Refills  . lisinopril-hydrochlorothiazide (ZESTORETIC) 10-12.5 MG tablet [Pharmacy Med Name: LISINOPRIL-HCTZ 10-12.5 MG TAB] 90 tablet 0    Sig: TAKE 1 TABLET BY MOUTH ONCE DAILY     Cardiovascular:  ACEI + Diuretic Combos Failed - 12/10/2019 11:41 AM      Failed - Na in normal range and within 180 days    Sodium  Date Value Ref Range Status  03/24/2019 143 135 - 146 mmol/L Final         Failed - K in normal range and within 180 days    Potassium  Date Value Ref Range Status  03/24/2019 4.6 3.5 - 5.3 mmol/L Final         Failed - Cr in normal range and within 180 days    Creat  Date Value Ref Range Status  03/24/2019 0.87 0.50 - 0.99 mg/dL Final    Comment:    For patients >60 years of age, the reference limit for Creatinine is approximately 13% higher for people identified as African-American. .          Failed - Ca in normal range and within 180 days    Calcium  Date Value Ref Range Status  03/24/2019 10.1 8.6 - 10.4 mg/dL Final         Passed - Patient is not pregnant      Passed - Last BP in normal range    BP Readings from Last 1 Encounters:  06/25/19 136/62         Passed - Valid encounter within last 6 months    Recent Outpatient Visits          5 months ago Prediabetes   [place], DO   8 months ago Type 2 diabetes mellitus with hyperglycemia, without long-term current use of insulin [facility])   [facility] Mikey College, NP   11 months ago Cigarette nicotine dependence without complication   [facility] Olin Hauser, DO   1 year ago Encounter for smoking cessation counseling   [facility] Merrilyn Puma, Jerrel Ivory, NP   1 year ago Encounter for annual physical exam   [facility] Mikey College, NP             Labs are due

## 2019-12-23 IMAGING — MR MR HEAD W/O CM
11 series · 41 of 48 positions shown · non-contrast
Comparison: None.

CLINICAL DATA: History of 2 surgeries for cerebral cyst removal.

EXAM:
MRI HEAD WITHOUT CONTRAST
TECHNIQUE: Multiplanar, multiecho pulse sequences of the brain and surrounding
structures were obtained without intravenous contrast.

[Series 5: ax dwi_tracew · axial · 3.0mm · 0.60mm/px · z∈[-63,+99]mm · 5 of 55 slices shown]
[im 1/55]
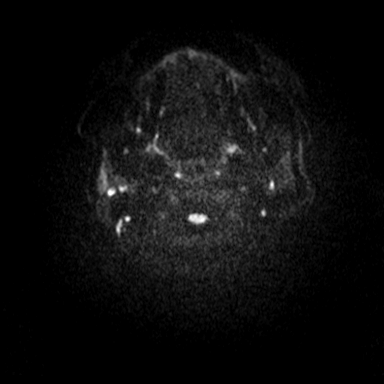
[im 14/55]
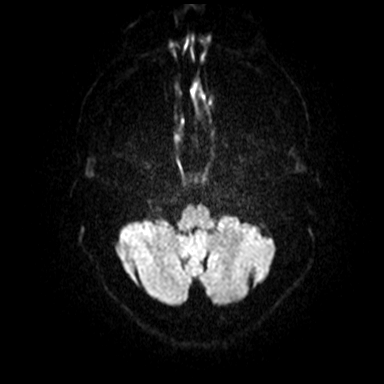
[im 28/55]
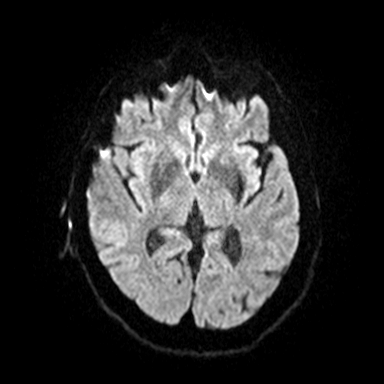
[im 41/55]
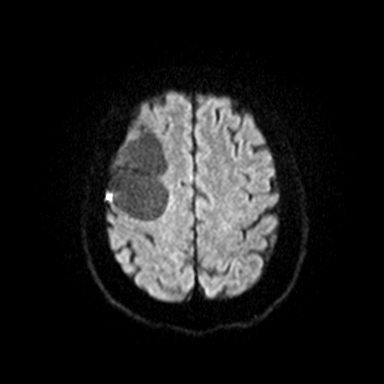
[im 55/55]
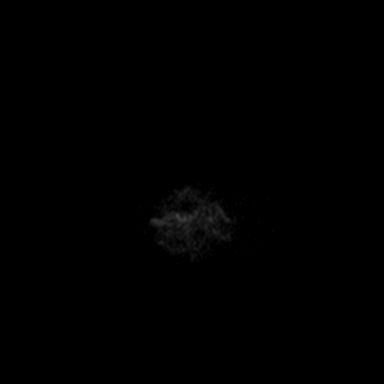

[Series 6: ax dwi_adc · axial · 3.0mm · 0.60mm/px · z∈[-63,+99]mm · 4 of 55 slices shown]
[im 1/55]
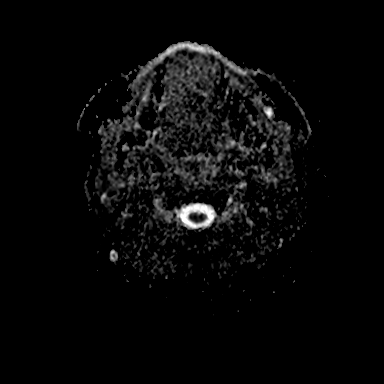
[im 19/55]
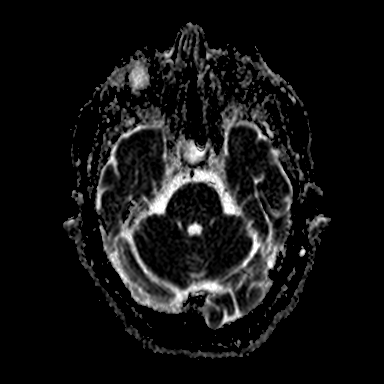
[im 37/55]
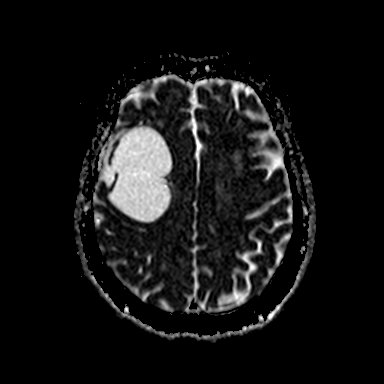
[im 55/55]
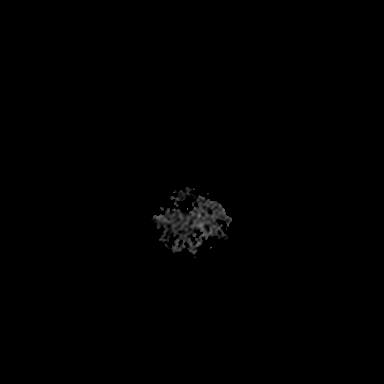

[Series 7: cor dwi_tracew · coronal · 5.0mm · 0.60mm/px · 3 of 36 slices shown]
[im 1/36]
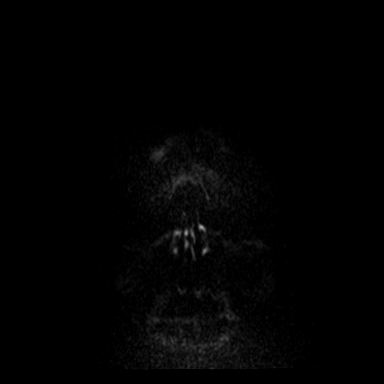
[im 18/36]
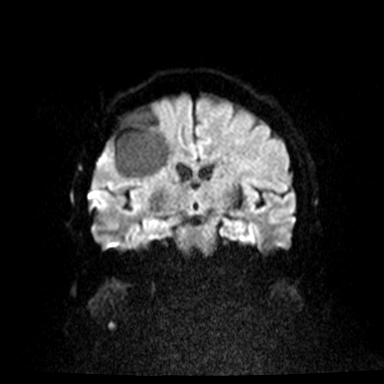
[im 36/36]
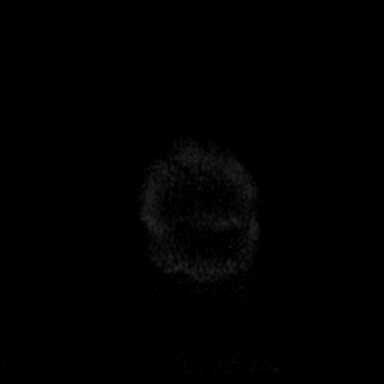

[Series 8: cor dwi_adc · coronal · 5.0mm · 0.60mm/px · 3 of 36 slices shown]
[im 1/36]
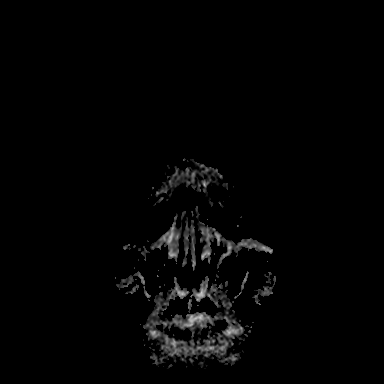
[im 18/36]
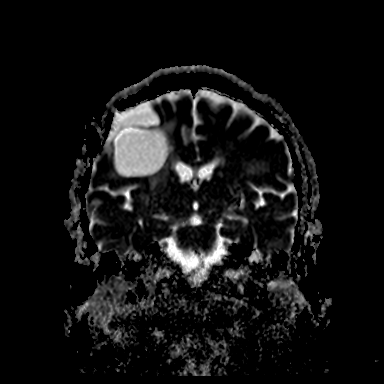
[im 36/36]
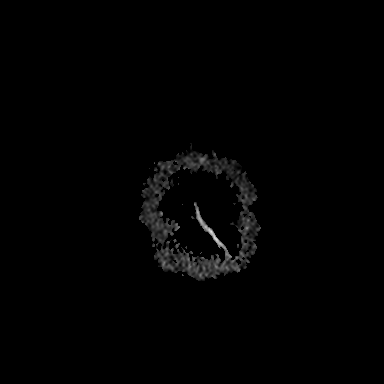

[Series 9: T1 · sagittal · 5.0mm · 0.62mm/px · 2 of 21 slices shown (1 of 2)]
[im 1/21]
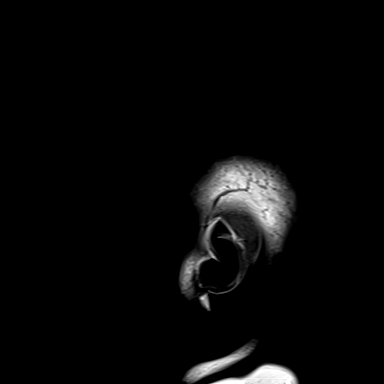
[im 21/21]
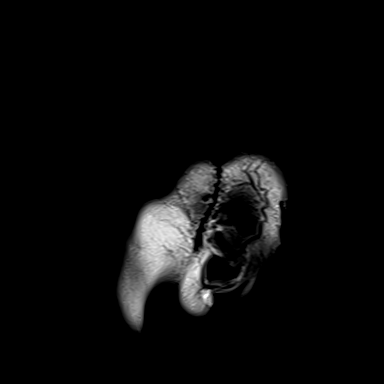

[Series 10: T2 · axial · 5.0mm · 0.53mm/px · z∈[-53,+91]mm · 2 of 25 slices shown (1 of 2)]
[im 1/25]
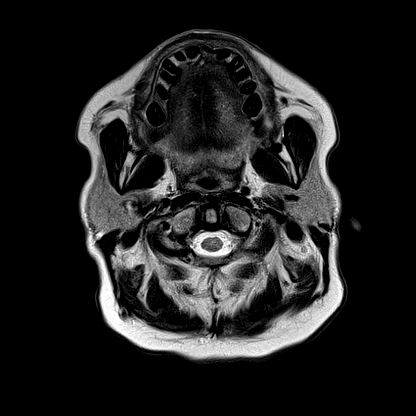
[im 25/25]
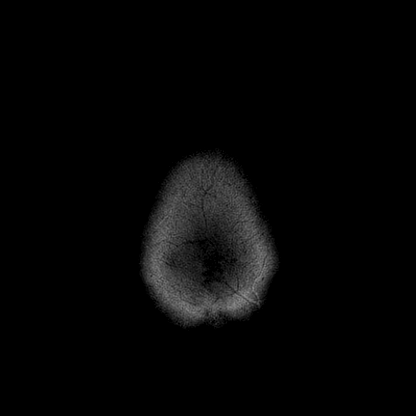

[Series 12: pha_images · axial · 3.0mm · 0.90mm/px · z∈[-70,+107]mm · 5 of 60 slices shown]
[im 1/60]
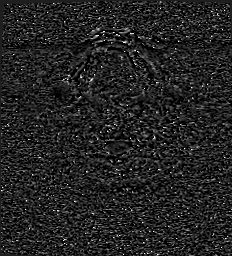
[im 15/60]
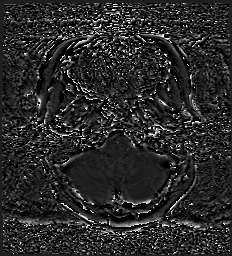
[im 30/60]
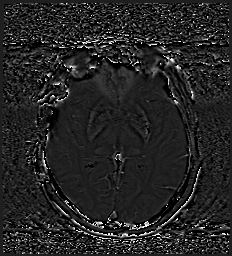
[im 45/60]
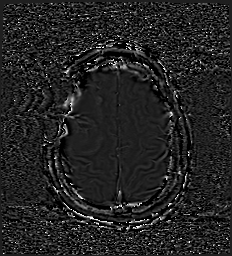
[im 60/60]
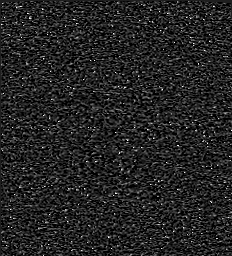

[Series 13: swi_images · axial · 3.0mm · 0.90mm/px · z∈[-70,+17]mm · 3 of 60 slices shown]
[im 1/60]
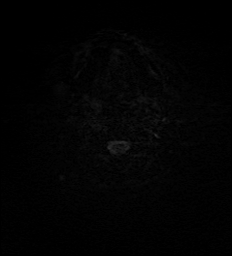
[im 15/60]
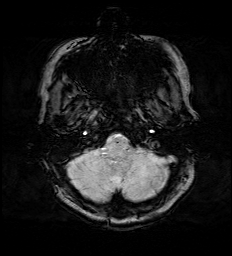
[im 30/60]
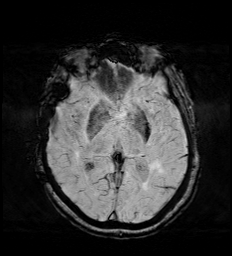

[Series 15: FLAIR · axial · 3.0mm · 0.53mm/px · z∈[-62,+100]mm · 4 of 55 slices shown]
[im 1/55]
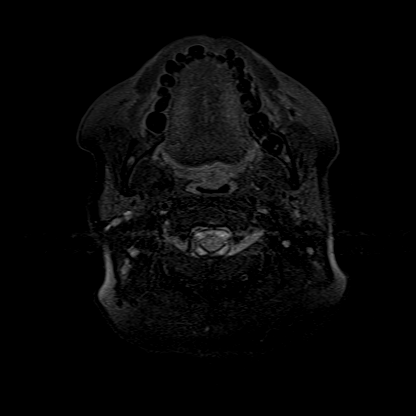
[im 19/55]
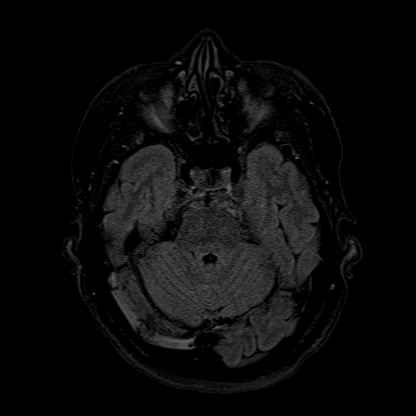
[im 37/55]
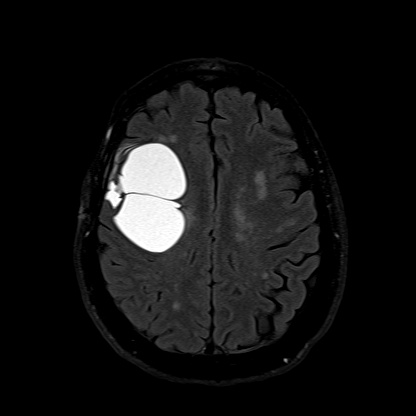
[im 55/55]
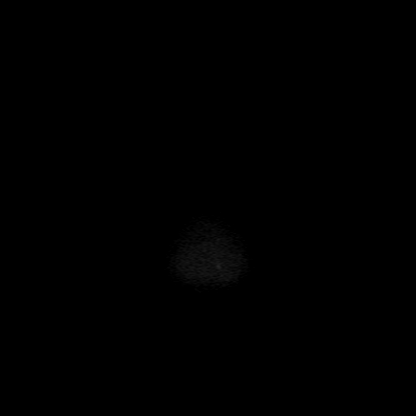

[Series 16: T1 · axial · 1.0mm · 0.98mm/px · z∈[-56,+103]mm · 8 of 160 slices shown (2 of 2)]
[im 1/160]
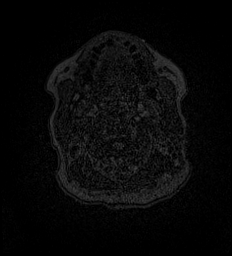
[im 27/160]
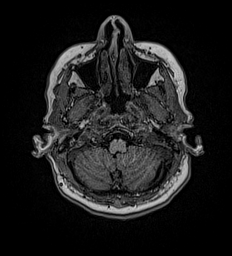
[im 54/160]
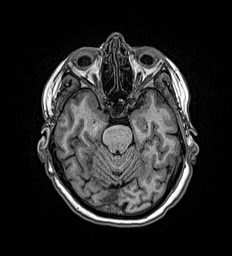
[im 67/160]
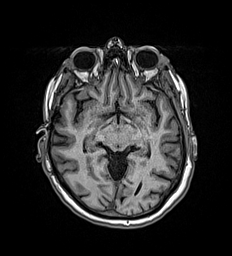
[im 93/160]
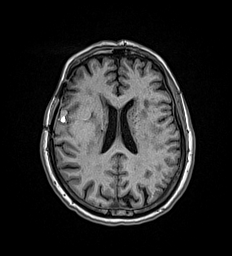
[im 107/160]
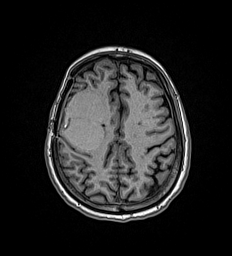
[im 133/160]
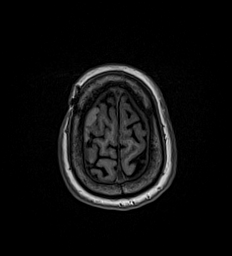
[im 160/160]
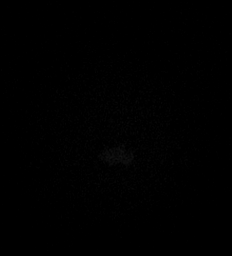

[Series 17: T2 · coronal · 5.0mm · 0.57mm/px · 2 of 27 slices shown (2 of 2)]
[im 1/27]
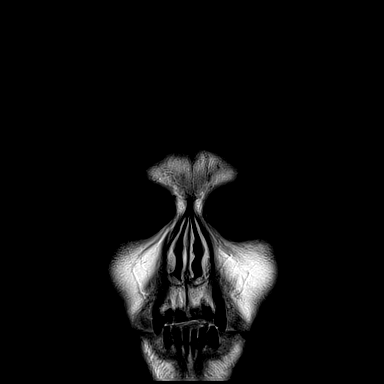
[im 27/27]
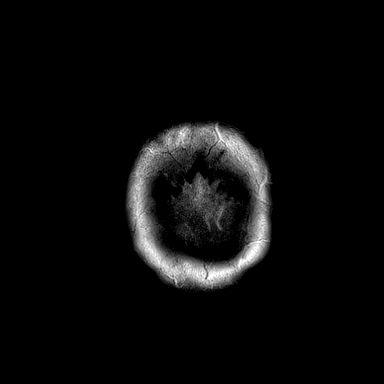

[41 of 48 positions shown; findings below may reference images not displayed]

FINDINGS: Brain: There is a cystic mass involving the posterior right frontal
lobe subjacent to a craniotomy. The mass measures 5.9 x 4.2 x
cm. Lack of IV contrast limits assessment for solid components,
however the majority of the mass demonstrates homogeneous fluid
signal which is hyperintense on T1, T2, and FLAIR sequences. A small
portion of the more lateral aspect of the mass contains some more
heterogeneous fluid, and there is evidence of scattered chronic
blood products. A few internal septations are also present. Minimal
T2 hyperintensity in the surrounding right frontal white matter may
reflect gliosis or edema. There is regional sulcal effacement
without significant mass effect on the ventricles or significant
midline shift.

Elsewhere, patchy T2 hyperintensities throughout the cerebral white
matter bilaterally are nonspecific but compatible with moderately
age advanced chronic small vessel ischemic disease. No acute infarct
or extra-axial fluid collection is evident. Cerebral volume is
normal for age.

Vascular: Major intracranial arterial flow voids are preserved. T2
hyperintensity in the right transverse and right sigmoid sinuses may
reflect slow flow or occlusion with slow flow favored.

Skull and upper cervical spine: Right-sided craniotomy.

Sinuses/Orbits: Minimal scattered paranasal sinus mucosal
thickening. Trace left mastoid effusion.

Other: None.
IMPRESSION: 1. 6 cm complex cystic posterior right frontal mass. Minimal
surrounding gliosis or edema. No midline shift.
2. Moderate chronic small vessel ischemic disease.
3. No acute intracranial abnormality.

## 2020-01-01 ENCOUNTER — Other Ambulatory Visit: Payer: Self-pay | Admitting: Family Medicine

## 2020-01-01 DIAGNOSIS — E1165 Type 2 diabetes mellitus with hyperglycemia: Secondary | ICD-10-CM

## 2020-01-01 NOTE — Telephone Encounter (Signed)
Requested medication (s) are due for refill today:   Yes  Requested medication (s) are on the active medication list:   Yes  Future visit scheduled:   No  LOV 06/25/2019.    Last ordered: 09/30/2019  #90  1 refill  Clinic note:  Returned because not sure who her PCP is now.   There is a mention in the note 06/25/2019 that she will be seeing a new provider.   I see where she is seeing Dr. Park Liter with [facility]  Pt had an appt with Cyndia Skeeters on 10/23/2019 that was cancelled.     Requested Prescriptions  Pending Prescriptions Disp Refills   metFORMIN (GLUCOPHAGE) 500 MG tablet [Pharmacy Med Name: METFORMIN HCL 500 MG TAB] 90 tablet 1    Sig: TAKE 1 TABLET BY MOUTH ONCE DAILY WITH BREAKFAST      Endocrinology:  Diabetes - Biguanides Failed - 01/01/2020  1:18 PM      Failed - HBA1C is between 0 and 7.9 and within 180 days    Hemoglobin A1C  Date Value Ref Range Status  06/25/2019 6.0 (A) 4.0 - 5.6 % Final   Hgb A1c MFr Bld  Date Value Ref Range Status  08/22/2018 6.3 (H) <5.7 % of total Hgb Final    Comment:    For someone without known diabetes, a hemoglobin  A1c value between 5.7% and 6.4% is consistent with prediabetes and should be confirmed with a  follow-up test. . For someone with known diabetes, a value <7% indicates that their diabetes is well controlled. A1c targets should be individualized based on duration of diabetes, age, comorbid conditions, and other considerations. . This assay result is consistent with an increased risk of diabetes. . Currently, no consensus exists regarding use of hemoglobin A1c for diagnosis of diabetes for children. .           Failed - Valid encounter within last 6 months    Recent Outpatient Visits           6 months ago Prediabetes   [facility] [place], Devonne Doughty, DO   9 months ago Type 2 diabetes mellitus with hyperglycemia, without long-term current use of insulin [facility])   [facility] Merrilyn Puma, Jerrel Ivory, NP   1 year ago Cigarette nicotine dependence without complication   [facility] Olin Hauser, DO   1 year ago Encounter for smoking cessation counseling   [facility] Merrilyn Puma, Jerrel Ivory, NP   1 year ago Encounter for annual physical exam   [facility] Merrilyn Puma, Jerrel Ivory, NP              Passed - Cr in normal range and within 360 days    Creat  Date Value Ref Range Status  03/24/2019 0.87 0.50 - 0.99 mg/dL Final    Comment:    For patients >23 years of age, the reference limit for Creatinine is approximately 13% higher for people identified as African-American. .           Passed - eGFR in normal range and within 360 days    GFR, Est African American  Date Value Ref Range Status  03/24/2019 82 > OR = 60 mL/min/1.43m Final   GFR, Est Non African American  Date Value Ref Range Status  03/24/2019 71 > OR = 60 mL/min/1.733mFinal
